# Patient Record
Sex: Female | Born: 1980 | Race: White | Hispanic: No | Smoking: Never smoker
Health system: Southern US, Community
[De-identification: ages and names within clinical notes are randomized; demographics above are authoritative.]

## PROBLEM LIST (undated history)

## (undated) DIAGNOSIS — Z3490 Encounter for supervision of normal pregnancy, unspecified, unspecified trimester: Secondary | ICD-10-CM

## (undated) DIAGNOSIS — Z9889 Other specified postprocedural states: Secondary | ICD-10-CM

## (undated) DIAGNOSIS — D649 Anemia, unspecified: Secondary | ICD-10-CM

## (undated) DIAGNOSIS — F419 Anxiety disorder, unspecified: Secondary | ICD-10-CM

## (undated) DIAGNOSIS — M549 Dorsalgia, unspecified: Secondary | ICD-10-CM

## (undated) HISTORY — DX: Anxiety disorder, unspecified: F41.9

## (undated) HISTORY — PX: WISDOM TOOTH EXTRACTION: SHX21

## (undated) HISTORY — DX: Anemia, unspecified: D64.9

## (undated) HISTORY — PX: BACK SURGERY: SHX140

---
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
## 2004-03-27 ENCOUNTER — Other Ambulatory Visit: Admission: RE | Admit: 2004-03-27 | Discharge: 2004-03-27 | Payer: Self-pay | Admitting: Obstetrics and Gynecology

## 2019-12-11 ENCOUNTER — Other Ambulatory Visit: Payer: Self-pay

## 2019-12-11 ENCOUNTER — Ambulatory Visit
Admission: EM | Admit: 2019-12-11 | Discharge: 2019-12-11 | Disposition: A | Discharge status: Home / Self Care | Payer: 59

## 2019-12-11 DIAGNOSIS — M546 Pain in thoracic spine: Secondary | ICD-10-CM

## 2019-12-11 DIAGNOSIS — S239XXA Sprain of unspecified parts of thorax, initial encounter: Secondary | ICD-10-CM | POA: Diagnosis not present

## 2019-12-11 MED ORDER — CYCLOBENZAPRINE HCL 10 MG PO TABS
10.0000 mg | ORAL_TABLET | Freq: Two times a day (BID) | ORAL | 0 refills | Status: DC | PRN
Start: 2019-12-11 — End: 2020-07-15

## 2019-12-11 NOTE — Discharge Instructions (Signed)
You are likely experiencing a pulled muscle in your back.   You can take Tylenol for pain and I have sent in Flexeril for you to take twice daily as needed for pain.  Follow up if not feeling better in 1-2 weeks.

## 2019-12-11 NOTE — ED Triage Notes (Signed)
Pt presents  with c/o right flank pain that began yesterday  . Pt has h/o back pain but states this feels like stabbing pain in mid right back with movement

## 2019-12-12 NOTE — ED Provider Notes (Signed)
[facility]   [id number] 12/11/19 Arrival Time: 1346  IW:PYKDX PAIN  SUBJECTIVE: History from: patient. Christine Ritter is a 39 y.o. female complains of right thoracic back pain that began yesterday.  Denies a precipitating event or specific injury.  Localizes the pain to the R thoracic back.  Describes the pain as intermittent and achy in character.  Has tried OTC medications without relief.  Symptoms are made worse with activity.  Denies similar symptoms in the past.  Denies fever, chills, erythema, ecchymosis, effusion, weakness, numbness and tingling, saddle paresthesias, loss of bowel or bladder function.      ROS: As per HPI.  All other pertinent ROS negative.     History reviewed. No pertinent past medical history. Past Surgical History:  Procedure Laterality Date  . BACK SURGERY     Not on File No current facility-administered medications on file prior to encounter.   Current Outpatient Medications on File Prior to Encounter  Medication Sig Dispense Refill  . metFORMIN (GLUCOPHAGE) 500 MG tablet Take 500 mg by mouth daily.     Social History   Socioeconomic History  . Marital status: Married    Spouse name: Not on file  . Number of children: Not on file  . Years of education: Not on file  . Highest education level: Not on file  Occupational History  . Not on file  Tobacco Use  . Smoking status: Never Smoker  . Smokeless tobacco: Never Used  Substance and Sexual Activity  . Alcohol use: Yes  . Drug use: Not Currently  . Sexual activity: Not on file  Other Topics Concern  . Not on file  Social History Narrative  . Not on file   Social Determinants of Health   Financial Resource Strain:   . Difficulty of Paying Living Expenses:   Food Insecurity:   . Worried About Programme researcher, broadcasting/film/video in the Last Year:   . Barista in the Last Year:   Transportation Needs:   . Freight forwarder (Medical):   Marland Kitchen Lack of Transportation (Non-Medical):    Physical Activity:   . Days of Exercise per Week:   . Minutes of Exercise per Session:   Stress:   . Feeling of Stress :   Social Connections:   . Frequency of Communication with Friends and Family:   . Frequency of Social Gatherings with Friends and Family:   . Attends Religious Services:   . Active Member of Clubs or Organizations:   . Attends Banker Meetings:   Marland Kitchen Marital Status:   Intimate Partner Violence:   . Fear of Current or Ex-Partner:   . Emotionally Abused:   Marland Kitchen Physically Abused:   . Sexually Abused:    History reviewed. No pertinent family history.  OBJECTIVE:  Vitals:   12/11/19 1407  BP: 117/81  Pulse: 88  Resp: 18  Temp: 99.1 F (37.3 C)  SpO2: 96%    General appearance: ALERT; in no acute distress.  Head: NCAT Lungs: Normal respiratory effort CV: XX pulses 2+ bilaterally. Cap refill < 2 seconds Musculoskeletal:  Inspection: Skin warm, dry, clear and intact without obvious erythema, effusion, or ecchymosis.  Palpation: tenderness to palpation at R thoracic back, no swelling, no deformity ROM: FROM active and passive Strength: 5/5 shld abduction, 5/5 shld adduction, 5/5 elbow flexion, 5/5 elbow extension, 5/5 grip strength, 5/5 hip flexion, 5/5 knee abduction, 5/5 knee adduction, 5/5 knee flexion, 5/5 knee extension, 5/5 dorsiflexion, 5/5  plantar flexion Stability: Anterior/ posterior drawer intact Skin: warm and dry Neurologic: Ambulates without difficulty; Sensation intact about the upper/ lower extremities Psychological: alert and cooperative; normal mood and affect  DIAGNOSTIC STUDIES:  No results found.   ASSESSMENT & PLAN:  1. Acute right-sided thoracic back pain   2. Thoracic back sprain, initial encounter       Meds ordered this encounter  Medications  . cyclobenzaprine (FLEXERIL) 10 MG tablet    Sig: Take 1 tablet (10 mg total) by mouth 2 (two) times daily as needed for muscle spasms.    Dispense:  20 tablet     Refill:  0    Order Specific Question:   Supervising Provider    Answer:   Chase Picket [id number]    Continue conservative management of rest, ice, and gentle stretches Take tylenol as needed for pain relief Take cyclobenzaprine at nighttime for symptomatic relief. Avoid driving or operating heavy machinery while using medication. Follow up with PCP if symptoms persist Return or go to the ER if you have any new or worsening symptoms (fever, chills, chest pain, abdominal pain, changes in bowel or bladder habits, pain radiating into lower legs, etc...)    Reviewed expectations re: course of current medical issues. Questions answered. Outlined signs and symptoms indicating need for more acute intervention. Patient verbalized understanding. After Visit Summary given.      Faustino Congress, NP 12/12/19 1123

## 2020-05-27 LAB — CYSTIC FIBROSIS DIAGNOSTIC STUDY: Interpretation-CFDNA:: NEGATIVE

## 2020-05-27 LAB — OB RESULTS CONSOLE GC/CHLAMYDIA
Chlamydia: NEGATIVE
Gonorrhea: NEGATIVE

## 2020-05-27 LAB — OB RESULTS CONSOLE RUBELLA ANTIBODY, IGM: Rubella: IMMUNE

## 2020-05-27 LAB — OB RESULTS CONSOLE HEPATITIS B SURFACE ANTIGEN: Hepatitis B Surface Ag: NEGATIVE

## 2020-05-27 LAB — OB RESULTS CONSOLE HIV ANTIBODY (ROUTINE TESTING): HIV: NONREACTIVE

## 2020-05-27 LAB — HEPATITIS C ANTIBODY: HCV Ab: NEGATIVE

## 2020-05-27 LAB — OB RESULTS CONSOLE ABO/RH: RH Type: POSITIVE

## 2020-05-27 LAB — OB RESULTS CONSOLE RPR: RPR: NONREACTIVE

## 2020-05-27 LAB — OB RESULTS CONSOLE ANTIBODY SCREEN: Antibody Screen: NEGATIVE

## 2020-05-27 LAB — SICKLE CELL SCREEN: Sickle Cell Screen: NEGATIVE

## 2020-07-15 ENCOUNTER — Ambulatory Visit
Admission: EM | Admit: 2020-07-15 | Discharge: 2020-07-15 | Disposition: A | Discharge status: Home / Self Care | Payer: 59 | Attending: Internal Medicine | Admitting: Internal Medicine

## 2020-07-15 ENCOUNTER — Other Ambulatory Visit: Payer: Self-pay

## 2020-07-15 DIAGNOSIS — Z1152 Encounter for screening for COVID-19: Secondary | ICD-10-CM | POA: Insufficient documentation

## 2020-07-15 DIAGNOSIS — J029 Acute pharyngitis, unspecified: Secondary | ICD-10-CM | POA: Diagnosis not present

## 2020-07-15 LAB — POCT RAPID STREP A (OFFICE): Rapid Strep A Screen: NEGATIVE

## 2020-07-15 NOTE — ED Triage Notes (Signed)
Pt presents with c/o sore throat and nasal congestion and sore throat that began yesterday

## 2020-07-15 NOTE — ED Provider Notes (Signed)
[facility]    CSN: [id number] Arrival date & time: 07/15/20  1024      History   Chief Complaint Chief Complaint  Patient presents with  . Nasal Congestion    HPI Christine Ritter is a 39 y.o. pleasant female currently [redacted] weeks pregnant comes to the urgent care with 1 day history of sore throat and nasal congestion.  Patient symptoms started yesterday afternoon and has been persistent.  No sputum production.  No shortness of breath or wheezing.  No chest tightness.  No dizziness or near syncopal episode.  No vomiting or diarrhea.  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  She is vaccinated against COVID-19 virus.   HPI  History reviewed. No pertinent past medical history.  There are no problems to display for this patient.   Past Surgical History:  Procedure Laterality Date  . BACK SURGERY      OB History    Gravida  1   Para      Term      Preterm      AB      Living        SAB      TAB      Ectopic      Multiple      Live Births               Home Medications    Prior to Admission medications   Medication Sig Start Date End Date Taking? Authorizing Provider  Prenatal Vit-Fe Fumarate-FA (PNV PRENATAL PLUS MULTIVITAMIN) 27-1 MG TABS Take 1 tablet by mouth daily.    [provider]  metFORMIN (GLUCOPHAGE) 500 MG tablet Take 500 mg by mouth daily. 11/26/19 07/15/20  [provider]    Family History History reviewed. No pertinent family history.  Social History Social History   Tobacco Use  . Smoking status: Never Smoker  . Smokeless tobacco: Never Used  Substance Use Topics  . Alcohol use: Yes  . Drug use: Not Currently     Allergies   Penicillins   Review of Systems Review of Systems  Constitutional: Negative.   HENT: Positive for congestion, rhinorrhea and sore throat.   Respiratory: Positive for cough.   Cardiovascular: Negative.   Gastrointestinal: Negative.       Physical Exam Triage Vital Signs ED Triage Vitals  Enc Vitals Group     BP 07/15/20 1103 106/74     Pulse Rate 07/15/20 1103 71     Resp 07/15/20 1103 20     Temp 07/15/20 1103 98.7 F (37.1 C)     Temp src --      SpO2 07/15/20 1103 97 %     Weight --      Height --      Head Circumference --      Peak Flow --      Pain Score 07/15/20 1059 3     Pain Loc --      Pain Edu? --      Excl. in GC? --    No data found.  Updated Vital Signs BP 106/74   Pulse 71   Temp 98.7 F (37.1 C)   Resp 20   LMP 11/20/2019   SpO2 97%   Visual Acuity Right Eye Distance:   Left Eye Distance:   Bilateral Distance:    Right Eye Near:   Left Eye Near:    Bilateral Near:     Physical  Exam Vitals and nursing note reviewed.  Constitutional:      General: She is not in acute distress.    Appearance: She is not ill-appearing.  HENT:     Right Ear: Tympanic membrane normal.     Left Ear: Tympanic membrane normal.     Mouth/Throat:     Mouth: Mucous membranes are moist.     Pharynx: No oropharyngeal exudate or posterior oropharyngeal erythema.  Cardiovascular:     Rate and Rhythm: Normal rate and regular rhythm.     Pulses: Normal pulses.     Heart sounds: Normal heart sounds.  Pulmonary:     Effort: Pulmonary effort is normal. No respiratory distress.     Breath sounds: Normal breath sounds. No wheezing or rhonchi.  Skin:    Capillary Refill: Capillary refill takes less than 2 seconds.  Neurological:     Mental Status: She is alert.      UC Treatments / Results  Labs (all labs ordered are listed, but only abnormal results are displayed) Labs Reviewed  CULTURE, GROUP A STREP ([facility])  COVID-19, FLU A+B AND RSV  POCT RAPID STREP A (OFFICE)    EKG   Radiology No results found.  Procedures Procedures (including critical care time)  Medications Ordered in UC Medications - No data to display  Initial Impression / Assessment and Plan / UC Course  I have  reviewed the triage vital signs and the nursing notes.  Pertinent labs & imaging results that were available during my care of the patient were reviewed by me and considered in my medical decision making (see chart for details).     1.  Acute viral pharyngitis: Warm salt water gargle Tylenol as needed for pain Point-of-care strep is negative Throat culture sent COVID-19/flu PCR is pending Patient is advised to quarantine until COVID-19 test results are available. Final Clinical Impressions(s) / UC Diagnoses   Final diagnoses:  Acute viral pharyngitis     Discharge Instructions     Warm salt water gargle Tylenol as needed for pain and/or fever Please quarantine until COVID-19 test results available Saline nasal spray as needed for nasal congestion If your symptoms worsen please return to urgent care to be reevaluated.   ED Prescriptions    None     PDMP not reviewed this encounter.   Merrilee Jansky, MD 07/15/20 1200

## 2020-07-15 NOTE — Discharge Instructions (Signed)
Warm salt water gargle Tylenol as needed for pain and/or fever Please quarantine until COVID-19 test results available Saline nasal spray as needed for nasal congestion If your symptoms worsen please return to urgent care to be reevaluated.

## 2020-07-17 LAB — COVID-19, FLU A+B AND RSV
Influenza A, NAA: NOT DETECTED
Influenza B, NAA: NOT DETECTED
RSV, NAA: NOT DETECTED
SARS-CoV-2, NAA: NOT DETECTED

## 2020-07-18 LAB — CULTURE, GROUP A STREP (THRC)

## 2020-08-20 ENCOUNTER — Other Ambulatory Visit: Payer: Self-pay

## 2020-08-20 ENCOUNTER — Ambulatory Visit
Admission: EM | Admit: 2020-08-20 | Discharge: 2020-08-20 | Disposition: A | Discharge status: Home / Self Care | Payer: 59

## 2020-08-20 ENCOUNTER — Encounter: Payer: Self-pay | Admitting: Emergency Medicine

## 2020-08-20 DIAGNOSIS — J3489 Other specified disorders of nose and nasal sinuses: Secondary | ICD-10-CM

## 2020-08-20 DIAGNOSIS — R509 Fever, unspecified: Secondary | ICD-10-CM

## 2020-08-20 DIAGNOSIS — R059 Cough, unspecified: Secondary | ICD-10-CM

## 2020-08-20 DIAGNOSIS — B349 Viral infection, unspecified: Secondary | ICD-10-CM

## 2020-08-20 DIAGNOSIS — R5383 Other fatigue: Secondary | ICD-10-CM

## 2020-08-20 DIAGNOSIS — R0981 Nasal congestion: Secondary | ICD-10-CM

## 2020-08-20 HISTORY — DX: Encounter for supervision of normal pregnancy, unspecified, unspecified trimester: Z34.90

## 2020-08-20 NOTE — ED Provider Notes (Signed)
[facility]   [id number] 08/20/20 Arrival Time: 0827   CC: COVID symptoms  SUBJECTIVE: History from: patient.  Christine Ritter is a 40 y.o. female who presents with sore throat, nasal congestion, bilateral ear pain, headache, fever since yesterday.  She is [redacted] weeks pregnant. Denies sick exposure to COVID, flu or strep. Denies recent travel. Has negative history of Covid. Has completed Covid vaccines.  Has not had booster. Has not taken OTC medications for this. There are no aggravating or alleviating factors. Denies previous symptoms in the past. Denies sinus pain, SOB, wheezing, chest pain, changes in bowel or bladder habits.    ROS: As per HPI.  All other pertinent ROS negative.     Past Medical History:  Diagnosis Date  . Pregnancy    Past Surgical History:  Procedure Laterality Date  . BACK SURGERY     Allergies  Allergen Reactions  . Penicillins    No current facility-administered medications on file prior to encounter.   Current Outpatient Medications on File Prior to Encounter  Medication Sig Dispense Refill  . aspirin EC 81 MG tablet Take 81 mg by mouth daily. Swallow whole.    . Prenatal Vit-Fe Fumarate-FA (PNV PRENATAL PLUS MULTIVITAMIN) 27-1 MG TABS Take 1 tablet by mouth daily.    . [DISCONTINUED] metFORMIN (GLUCOPHAGE) 500 MG tablet Take 500 mg by mouth daily.     Social History   Socioeconomic History  . Marital status: Legally Separated    Spouse name: Not on file  . Number of children: Not on file  . Years of education: Not on file  . Highest education level: Not on file  Occupational History  . Not on file  Tobacco Use  . Smoking status: Never Smoker  . Smokeless tobacco: Never Used  Substance and Sexual Activity  . Alcohol use: Yes  . Drug use: Not Currently  . Sexual activity: Not on file  Other Topics Concern  . Not on file  Social History Narrative  . Not on file   Social Determinants of Health   Financial Resource Strain: Not  on file  Food Insecurity: Not on file  Transportation Needs: Not on file  Physical Activity: Not on file  Stress: Not on file  Social Connections: Not on file  Intimate Partner Violence: Not on file   History reviewed. No pertinent family history.  OBJECTIVE:  Vitals:   08/20/20 0905 08/20/20 0910  BP:  106/69  Pulse:  95  Resp:  20  Temp:  98.6 F (37 C)  TempSrc:  Oral  SpO2:  95%  Weight: 266 lb (120.7 kg)   Height: 5\' 8"  (1.727 m)      General appearance: alert; appears fatigued, but nontoxic; speaking in full sentences and tolerating own secretions HEENT: NCAT; Ears: EACs clear, TMs pearly gray; Eyes: PERRL.  EOM grossly intact. Sinuses: nontender; Nose: nares patent with clear rhinorrhea, Throat: oropharynx erythematous, cobblestoning present, tonsils non erythematous or enlarged, uvula midline  Neck: supple without LAD Lungs: unlabored respirations, symmetrical air entry; cough: mild; no respiratory distress; CTAB Heart: regular rate and rhythm.  Radial pulses 2+ symmetrical bilaterally Skin: warm and dry Psychological: alert and cooperative; normal mood and affect  LABS:  No results found for this or any previous visit (from the past 24 hour(s)).   ASSESSMENT & PLAN:  1. Viral illness   2. Cough   3. Other fatigue   4. Rhinorrhea   5. Nasal congestion   6. Fever, unspecified  fever cause     Continue supportive care at home COVID and flu testing ordered.  It will take between 2-3 days for test results. Someone will contact you regarding abnormal results.   Work note provided Patient should remain in quarantine until they have received Covid results.  If negative you may resume normal activities (go back to work/school) while practicing hand hygiene, social distance, and mask wearing.  If positive, patient should remain in quarantine for at least 5 days from symptom onset AND greater than 72 hours after symptoms resolution (absence of fever without the use of  fever-reducing medication and improvement in respiratory symptoms), whichever is longer Get plenty of rest and push fluids Use OTC zyrtec for nasal congestion, runny nose, and/or sore throat Use OTC flonase for nasal congestion and runny nose Use medications daily for symptom relief Use OTC medications like ibuprofen or tylenol as needed fever or pain Call or go to the ED if you have any new or worsening symptoms such as fever, worsening cough, shortness of breath, chest tightness, chest pain, turning blue, changes in mental status.  Reviewed expectations re: course of current medical issues. Questions answered. Outlined signs and symptoms indicating need for more acute intervention. Patient verbalized understanding. After Visit Summary given.         Moshe Cipro, NP 08/20/20 1202

## 2020-08-20 NOTE — Discharge Instructions (Signed)
Your COVID test is pending.  You should self quarantine until the test result is back.    Take Tylenol or ibuprofen as needed for fever or discomfort.  Rest and keep yourself hydrated.    Follow-up with your primary care provider if your symptoms are not improving.     

## 2020-08-20 NOTE — ED Triage Notes (Addendum)
Pt c/o of sore throat, nasal congestion, bilateral ear pain, headache and fever x 1 day. She is [redacted] weeks pregnant. Vomited 3-4 times within 24 hours.

## 2020-08-22 LAB — SARS-COV-2, NAA 2 DAY TAT

## 2020-08-22 LAB — NOVEL CORONAVIRUS, NAA: SARS-CoV-2, NAA: DETECTED — AB

## 2020-11-19 ENCOUNTER — Encounter: Payer: Self-pay | Admitting: *Deleted

## 2020-11-19 DIAGNOSIS — O0990 Supervision of high risk pregnancy, unspecified, unspecified trimester: Secondary | ICD-10-CM | POA: Insufficient documentation

## 2020-11-19 DIAGNOSIS — O24419 Gestational diabetes mellitus in pregnancy, unspecified control: Secondary | ICD-10-CM | POA: Insufficient documentation

## 2020-11-20 ENCOUNTER — Other Ambulatory Visit: Payer: Self-pay

## 2020-11-20 ENCOUNTER — Ambulatory Visit (INDEPENDENT_AMBULATORY_CARE_PROVIDER_SITE_OTHER): Payer: 59 | Admitting: Advanced Practice Midwife

## 2020-11-20 ENCOUNTER — Encounter: Payer: Self-pay | Admitting: Advanced Practice Midwife

## 2020-11-20 ENCOUNTER — Other Ambulatory Visit (HOSPITAL_COMMUNITY)
Admission: RE | Admit: 2020-11-20 | Discharge: 2020-11-20 | Disposition: A | Discharge status: Home / Self Care | Payer: 59 | Source: Ambulatory Visit | Attending: Advanced Practice Midwife | Admitting: Advanced Practice Midwife

## 2020-11-20 ENCOUNTER — Ambulatory Visit: Payer: 59 | Admitting: *Deleted

## 2020-11-20 VITALS — BP 118/71 | HR 78 | Wt 270.0 lb

## 2020-11-20 DIAGNOSIS — Z23 Encounter for immunization: Secondary | ICD-10-CM | POA: Diagnosis not present

## 2020-11-20 DIAGNOSIS — O24410 Gestational diabetes mellitus in pregnancy, diet controlled: Secondary | ICD-10-CM

## 2020-11-20 DIAGNOSIS — O0993 Supervision of high risk pregnancy, unspecified, third trimester: Secondary | ICD-10-CM | POA: Diagnosis present

## 2020-11-20 DIAGNOSIS — Z3A36 36 weeks gestation of pregnancy: Secondary | ICD-10-CM | POA: Insufficient documentation

## 2020-11-20 DIAGNOSIS — Z803 Family history of malignant neoplasm of breast: Secondary | ICD-10-CM

## 2020-11-20 NOTE — Progress Notes (Signed)
   HIGH-RISK PREGNANCY VISIT Patient name: Christine Ritter MRN [id number]  Date of birth: Dec 24, 1980 Chief Complaint:   Initial Prenatal Visit (Transfer care 36wk) OB is retiring end of this month, had planned IOL on 5/3 d/t GDM. (due to being a [employer], prefers 5/4!)  Was on Metformin, prior to pregnancy for prediabetes.  Not checking blood sugars anymore except on occasion, Has been <120 every time she has checked it.  History of Present Illness:   Christine Ritter is a 40 y.o. G4P2002 female at [redacted]w[redacted]d with an Estimated Date of Delivery: 12/14/20 being seen today for ongoing management of a high-risk pregnancy complicated by A1DM  Contractions: Not present. Vag. Bleeding: None.  Movement: Present. denies leaking of fluid.  Review of Systems:   Pertinent items are noted in HPI Denies abnormal vaginal discharge w/ itching/odor/irritation, headaches, visual changes, shortness of breath, chest pain, abdominal pain, severe nausea/vomiting, or problems with urination or bowel movements unless otherwise stated above. Pertinent History Reviewed:  Reviewed past medical,surgical, social, obstetrical and family history.  Reviewed problem list, medications and allergies. Physical Assessment:   Vitals:   11/20/20 1046  BP: 118/71  Pulse: 78  Weight: 270 lb (122.5 kg)  Body mass index is 41.05 kg/m.           Physical Examination:   General appearance: alert, well appearing, and in no distress  Mental status: alert, oriented to person, place, and time  Skin: warm & dry   Extremities: Edema: None    Cardiovascular: normal heart rate noted  Respiratory: normal respiratory effort, no distress  Abdomen: gravid, soft, non-tender  Pelvic: Cervical exam performed  Dilation: 1 Effacement (%): 0 Station: -3  Fetal Status: Fetal Heart Rate (bpm): 155   Movement: Present Presentation: Vertex  Fetal Surveillance Testing today: doppler   No results found for this or any previous visit (from the past 24  hour(s)).  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   Labs/procedures today: GBS/GC/CHL   Reviewed: Term labor symptoms and general obstetric precautions including but not limited to vaginal bleeding, contractions, leaking of fluid and fetal movement were reviewed in detail with the patient.  All questions were answered.   Follow-up: Return for [facility] Mychart visit   either Thursday or Friday; 2 weeks for US/EFW/and HROB.  No future appointments.  Orders Placed This Encounter  Procedures  . Strep Gp B NAA+Rflx  . Tdap vaccine greater than or equal to 7yo IM  . OB RESULTS CONSOLE RPR  . OB RESULTS CONSOLE HIV antibody  . OB RESULTS CONSOLE Rubella Antibody  . OB RESULTS CONSOLE Hepatitis B surface antigen  . Hepatitis C antibody  . Sickle cell screen  . Cystic fibrosis diagnostic study  . OB RESULTS CONSOLE GC/Chlamydia  . OB RESULTS CONSOLE ABO/Rh  . OB RESULTS CONSOLE Antibody Screen   Jacklyn Shell DNP, CNM 11/20/2020 11:46 AM

## 2020-11-20 NOTE — Patient Instructions (Signed)
Christine Ritter, I greatly value your feedback.  If you receive a survey following your visit with [place] today, we appreciate you taking the time to fill it out.  Thanks, [name], DNP, CNM  [facility] HAS MOVED!!! It is now [facility] ([address] [place], [place] [address]) Entrance located off of [address] Free 24/7 valet parking   Go to [email or web address] to register for FREE online childbirth classes    Call the office [phone]) or go to [facility] if:  You begin to have strong, frequent contractions  Your water breaks.  Sometimes it is a big gush of fluid, sometimes it is just a trickle that keeps getting your panties wet or running down your legs  You have vaginal bleeding.  It is normal to have a small amount of spotting if your cervix was checked.   You don't feel your baby moving like normal.  If you don't, get you something to eat and drink and lay down and focus on feeling your baby move.  You should feel at least 10 movements in 2 hours.  If you don't, you should call the office or go to [facility].   Home Blood Pressure Monitoring for Patients   Your provider has recommended that you check your blood pressure (BP) at least once a week at home. If you do not have a blood pressure cuff at home, one will be provided for you. Contact your provider if you have not received your monitor within 1 week.   Helpful Tips for Accurate Home Blood Pressure Checks  . Don't smoke, exercise, or drink caffeine 30 minutes before checking your BP . Use the restroom before checking your BP (a full bladder can raise your pressure) . Relax in a comfortable upright chair . Feet on the ground . Left arm resting comfortably on a flat surface at the level of your heart . Legs uncrossed . Back supported . Sit quietly and don't talk . Place the cuff on your bare arm . Adjust snuggly, so that only two fingertips can fit  between your skin and the top of the cuff . Check 2 readings separated by at least one minute . Keep a log of your BP readings . For a visual, please reference this diagram: http://ccnc.care/bpdiagram  Provider Name: [facility]     Phone: [phone]  Zone 1: ALL CLEAR  Continue to monitor your symptoms:  . BP reading is less than 140 (top number) or less than 90 (bottom number)  . No right upper stomach pain . No headaches or seeing spots . No feeling nauseated or throwing up . No swelling in face and hands  Zone 2: CAUTION Call your doctor's office for any of the following:  . BP reading is greater than 140 (top number) or greater than 90 (bottom number)  . Stomach pain under your ribs in the middle or right side . Headaches or seeing spots . Feeling nauseated or throwing up . Swelling in face and hands  Zone 3: EMERGENCY  Seek immediate medical care if you have any of the following:  . BP reading is greater than160 (top number) or greater than 110 (bottom number) . Severe headaches not improving with Tylenol . Serious difficulty catching your breath . Any worsening symptoms from Zone 2

## 2020-11-21 LAB — CERVICOVAGINAL ANCILLARY ONLY
Chlamydia: NEGATIVE
Comment: NEGATIVE
Comment: NORMAL
Neisseria Gonorrhea: NEGATIVE

## 2020-11-23 LAB — STREP GP B NAA+RFLX: Strep Gp B NAA+Rflx: NEGATIVE

## 2020-11-26 DIAGNOSIS — Z029 Encounter for administrative examinations, unspecified: Secondary | ICD-10-CM

## 2020-11-28 ENCOUNTER — Ambulatory Visit (INDEPENDENT_AMBULATORY_CARE_PROVIDER_SITE_OTHER): Payer: 59 | Admitting: Obstetrics & Gynecology

## 2020-11-28 ENCOUNTER — Other Ambulatory Visit: Payer: Self-pay

## 2020-11-28 ENCOUNTER — Encounter: Payer: Self-pay | Admitting: Obstetrics & Gynecology

## 2020-11-28 VITALS — BP 122/68 | HR 86 | Wt 270.6 lb

## 2020-11-28 DIAGNOSIS — O0993 Supervision of high risk pregnancy, unspecified, third trimester: Secondary | ICD-10-CM | POA: Diagnosis not present

## 2020-11-28 DIAGNOSIS — O09523 Supervision of elderly multigravida, third trimester: Secondary | ICD-10-CM | POA: Diagnosis not present

## 2020-11-28 DIAGNOSIS — O24410 Gestational diabetes mellitus in pregnancy, diet controlled: Secondary | ICD-10-CM

## 2020-11-28 NOTE — Progress Notes (Signed)
HIGH-RISK PREGNANCY VISIT Patient name: Christine Ritter MRN [id number]  Date of birth: 1981/04/21 Chief Complaint:   Routine Prenatal Visit  History of Present Illness:   Christine Ritter is a 40 y.o. G41P2002 female at [redacted]w[redacted]d with an Estimated Date of Delivery: 12/14/20 being seen today for ongoing management of a high-risk pregnancy complicated by   Diabetes mellitus A1DM- pt recently transferred care @ 36wks Per pt- her prior OB did not seem as concerned about her sugars -All sugars elevated though trending down once she started making diet changes -previously on metformin  Today she reports occasional vaginal pain/contractions.   Contractions: Not present. Vag. Bleeding: None.  Movement: Present. denies leaking of fluid.   Depression screen [facility] 2/9 11/20/2020  Decreased Interest 0  Down, Depressed, Hopeless 0  PHQ - 2 Score 0  Altered sleeping 1  Tired, decreased energy 1  Change in appetite 0  Feeling bad or failure about yourself  0  Trouble concentrating 0  Moving slowly or fidgety/restless 0  Suicidal thoughts 0  PHQ-9 Score 2     Current Outpatient Medications  Medication Instructions  . aspirin EC 81 mg, Oral, Daily, Swallow whole.  . loratadine (CLARITIN) 10 mg, Oral, Daily  . Prenatal Vit-Fe Fumarate-FA (PNV PRENATAL PLUS MULTIVITAMIN) 27-1 MG TABS 1 tablet, Oral, Daily  . valACYclovir (VALTREX) 500 MG tablet Daily     Review of Systems:   Pertinent items are noted in HPI Denies abnormal vaginal discharge w/ itching/odor/irritation, headaches, visual changes, shortness of breath, chest pain, abdominal pain, severe nausea/vomiting, or problems with urination or bowel movements unless otherwise stated above. Pertinent History Reviewed:  Reviewed past medical,surgical, social, obstetrical and family history.  Reviewed problem list, medications and allergies. Physical Assessment:   Vitals:   11/28/20 1130  BP: 122/68  Pulse: 86  Weight: 270 lb 9.6 oz (122.7 kg)   Body mass index is 41.14 kg/m.           Physical Examination:   General appearance: alert, well appearing, and in no distress  Mental status: alert, oriented to person, place, and time  Skin: warm & dry   Extremities: Edema: None    Cardiovascular: normal heart rate noted  Respiratory: normal respiratory effort, no distress  Abdomen: gravid, soft, non-tender  Pelvic: Cervical exam performed  Dilation: 1 Effacement (%): 0 Station: -3  Fetal Status: Fetal Heart Rate (bpm): 155 Fundal Height: 36 cm Movement: Present Presentation: Vertex  Fetal Surveillance Testing today: none   Chaperone: declined    No results found for this or any previous visit (from the past 24 hour(s)).   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   Labs/procedures today: none  Treatment Plan:  May start Metformin pending sugars, also to start antepartum testing, []  IOL @ 39wks   Reviewed: Term labor symptoms and general obstetric precautions including but not limited to vaginal bleeding, contractions, leaking of fluid and fetal movement were reviewed in detail with the patient.  All questions were answered.   Follow-up: Return  in about 1 week (around 12/05/2020) for HROB visit.   Future Appointments  Date Time Provider Department Center  12/03/2020  3:30 PM [facility] [place] [facility] None  12/04/2020  4:10 PM Eure, Amaryllis Dyke, MD [facility]    No orders of the defined types were placed in this encounter.   Myna Hidalgo, DO Attending Obstetrician & Gynecologist,  [facility] for [employer], [facility]

## 2020-11-30 ENCOUNTER — Other Ambulatory Visit: Payer: Self-pay | Admitting: Obstetrics & Gynecology

## 2020-12-01 ENCOUNTER — Telehealth: Payer: Self-pay | Admitting: Obstetrics & Gynecology

## 2020-12-01 ENCOUNTER — Other Ambulatory Visit: Payer: Self-pay | Admitting: Obstetrics & Gynecology

## 2020-12-01 ENCOUNTER — Other Ambulatory Visit: Payer: Self-pay

## 2020-12-01 DIAGNOSIS — Z363 Encounter for antenatal screening for malformations: Secondary | ICD-10-CM

## 2020-12-01 DIAGNOSIS — O24410 Gestational diabetes mellitus in pregnancy, diet controlled: Secondary | ICD-10-CM

## 2020-12-01 MED ORDER — METFORMIN HCL 500 MG PO TABS: 500.0000 mg | ORAL_TABLET | Freq: Every day | ORAL | 11 refills | Status: DC

## 2020-12-01 MED ORDER — VALACYCLOVIR HCL 500 MG PO TABS: 500.0000 mg | ORAL_TABLET | Freq: Three times a day (TID) | ORAL | 3 refills | Status: DC

## 2020-12-01 NOTE — Telephone Encounter (Signed)
Patient stated that her metformin wasn't called into the pharmacy and wants to discuss valACYclovir (VALTREX) 500 MG tablet . Clinical staff will follow up with patient.

## 2020-12-02 ENCOUNTER — Encounter: Payer: Self-pay | Admitting: Obstetrics & Gynecology

## 2020-12-02 ENCOUNTER — Other Ambulatory Visit: Payer: Self-pay | Admitting: Advanced Practice Midwife

## 2020-12-03 ENCOUNTER — Ambulatory Visit (INDEPENDENT_AMBULATORY_CARE_PROVIDER_SITE_OTHER): Payer: 59

## 2020-12-03 ENCOUNTER — Other Ambulatory Visit: Payer: Self-pay

## 2020-12-03 DIAGNOSIS — O24410 Gestational diabetes mellitus in pregnancy, diet controlled: Secondary | ICD-10-CM

## 2020-12-03 DIAGNOSIS — Z363 Encounter for antenatal screening for malformations: Secondary | ICD-10-CM | POA: Diagnosis not present

## 2020-12-03 DIAGNOSIS — O0993 Supervision of high risk pregnancy, unspecified, third trimester: Secondary | ICD-10-CM

## 2020-12-03 DIAGNOSIS — Z3A38 38 weeks gestation of pregnancy: Secondary | ICD-10-CM | POA: Diagnosis not present

## 2020-12-03 NOTE — Progress Notes (Signed)
[place] 38+3 wks,cephalic,FHR 120 bpm,anterior placenta gr 3,AFI 15.9 cm,BPP 8/8,EFW 4933 g 99.9%

## 2020-12-04 ENCOUNTER — Ambulatory Visit (INDEPENDENT_AMBULATORY_CARE_PROVIDER_SITE_OTHER): Payer: 59 | Admitting: Obstetrics & Gynecology

## 2020-12-04 VITALS — BP 120/80 | HR 89 | Wt 276.0 lb

## 2020-12-04 DIAGNOSIS — O09523 Supervision of elderly multigravida, third trimester: Secondary | ICD-10-CM

## 2020-12-04 DIAGNOSIS — O24410 Gestational diabetes mellitus in pregnancy, diet controlled: Secondary | ICD-10-CM

## 2020-12-04 DIAGNOSIS — O0993 Supervision of high risk pregnancy, unspecified, third trimester: Secondary | ICD-10-CM | POA: Diagnosis not present

## 2020-12-04 DIAGNOSIS — Z1389 Encounter for screening for other disorder: Secondary | ICD-10-CM

## 2020-12-04 LAB — POCT URINALYSIS DIPSTICK OB
Blood, UA: NEGATIVE
Glucose, UA: NEGATIVE
Leukocytes, UA: NEGATIVE
Nitrite, UA: NEGATIVE

## 2020-12-04 NOTE — Progress Notes (Unsigned)
HIGH-RISK PREGNANCY VISIT Patient name: Christine Ritter MRN [id number]  Date of birth: October 30, 1980 Chief Complaint:   Routine Prenatal Visit and High Risk Gestation  History of Present Illness:   Christine Ritter is a 40 y.o. G28P2002 female at [redacted]w[redacted]d with an Estimated Date of Delivery: 12/14/20 being seen today for ongoing management of a high-risk pregnancy complicated by {High Risk OB:23190}.  Today she reports {sx:14538}.  Depression screen [facility] 2/9 11/20/2020  Decreased Interest 0  Down, Depressed, Hopeless 0  PHQ - 2 Score 0  Altered sleeping 1  Tired, decreased energy 1  Change in appetite 0  Feeling bad or failure about yourself  0  Trouble concentrating 0  Moving slowly or fidgety/restless 0  Suicidal thoughts 0  PHQ-9 Score 2    Contractions: Not present. Vag. Bleeding: None.  Movement: Present. {Actions; denies-reports:120008} leaking of fluid.  Review of Systems:   Pertinent items are noted in HPI Denies abnormal vaginal discharge w/ itching/odor/irritation, headaches, visual changes, shortness of breath, chest pain, abdominal pain, severe nausea/vomiting, or problems with urination or bowel movements unless otherwise stated above. Pertinent History Reviewed:  Reviewed past medical,surgical, social, obstetrical and family history.  Reviewed problem list, medications and allergies. Physical Assessment:   Vitals:   12/04/20 1634  BP: 120/80  Pulse: 89  Weight: 276 lb (125.2 kg)  Body mass index is 41.97 kg/m.           Physical Examination:   General appearance: {appearance:315021::"alert, well appearing, and in no distress"}  Mental status: {pe mental status_general use:313008::"alert, oriented to person, place, and time"}  Skin: warm & dry   Extremities: Edema: Trace    Cardiovascular: normal heart rate noted  Respiratory: normal respiratory effort, no distress  Abdomen: gravid, soft, non-tender  Pelvic: {Blank single:19197::"Cervical exam performed","Cervical exam  deferred"}         Fetal Status:     Movement: Present    Fetal Surveillance Testing today: ***   Chaperone: {Chaperone:19197::"N/A","Latisha Cresenzo","Janet Young","Amanda Andrews","Peggy Dones","Nicole Jones","Angel Neas"}    Results for orders placed or performed in visit on 12/04/20 (from the past 24 hour(s))  POC Urinalysis Dipstick OB   Collection Time: 12/04/20  4:36 PM  Result Value Ref Range   Color, UA     Clarity, UA     Glucose, UA Negative Negative   Bilirubin, UA     Ketones, UA large    Spec Grav, UA     Blood, UA neg    pH, UA     POC,PROTEIN,UA Trace Negative, Trace, Small (1+), Moderate (2+), Large (3+), 4+   Urobilinogen, UA     Nitrite, UA neg    Leukocytes, UA Negative Negative   Appearance     Odor      Assessment & Plan:  High-risk pregnancy: G3P2002 at [redacted]w[redacted]d with an Estimated Date of Delivery: 12/14/20   1) ***, {stable/unstable:60080}  2) ***, {stable/unstable:60080}  Meds: No orders of the defined types were placed in this encounter.   Labs/procedures today: {ob lab/procedures:25214}  Treatment Plan:  ***  Reviewed: {Blank single:19197::"Term","Preterm"} labor symptoms and general obstetric precautions including but not limited to vaginal bleeding, contractions, leaking of fluid and fetal movement were reviewed in detail with the patient.  All questions were answered. {does does not:25387::"Does"} have home bp cuff. Office bp cuff given: {yes/no/default n/a:21102::"not applicable"}. Check bp {weekly daily:25388::"weekly"}, let us know if consistently {pregnant bp:25389::">140 and/or >90"}.  Follow-up: No follow-ups on file.   Future Appointments  Date Time Provider Department Center  12/09/2020 11:10 AM [facility] NURSE [facility]  12/12/2020 10:50 AM Osamu Olguin, Amaryllis Dyke, MD [facility]    Orders Placed This Encounter  Procedures  . POC Urinalysis Dipstick OB   Lazaro Arms  12/04/2020 5:15 PM

## 2020-12-05 ENCOUNTER — Encounter (HOSPITAL_COMMUNITY): Payer: Self-pay

## 2020-12-05 NOTE — Patient Instructions (Signed)
KENI WAFER  12/05/2020   Your procedure is scheduled on:  12/10/2020  Arrive at 3:30 PM at Entrance C on [address] at [facility]  and [employer]. You are invited to use the FREE valet parking or use the Visitor's parking deck.  Pick up the phone at the desk and dial [phone].  Call this number if you have problems the morning of surgery: [phone]  Remember:   Do not eat food:(After Midnight) Desps de medianoche.  Do not drink clear liquids: (6 Hours before arrival) 6 horas ante llegada.  Take these medicines the morning of surgery with A SIP OF WATER:  Take valtrex as prescribed   Do not wear jewelry, make-up or nail polish.  Do not wear lotions, powders, or perfumes. Do not wear deodorant.  Do not shave 48 hours prior to surgery.  Do not bring valuables to the hospital.  [facility] is not   responsible for any belongings or valuables brought to the hospital.  Contacts, dentures or bridgework may not be worn into surgery.  Leave suitcase in the car. After surgery it may be brought to your room.  For patients admitted to the hospital, checkout time is 11:00 AM the day of              discharge.      Please read over the following fact sheets that you were given:     Preparing for Surgery

## 2020-12-07 ENCOUNTER — Other Ambulatory Visit: Payer: Self-pay | Admitting: Obstetrics & Gynecology

## 2020-12-08 ENCOUNTER — Other Ambulatory Visit (HOSPITAL_COMMUNITY): Payer: 59

## 2020-12-08 ENCOUNTER — Encounter (HOSPITAL_COMMUNITY): Admission: RE | Admit: 2020-12-08 | Payer: 59 | Source: Ambulatory Visit

## 2020-12-09 ENCOUNTER — Encounter (HOSPITAL_COMMUNITY)
Admission: RE | Admit: 2020-12-09 | Discharge: 2020-12-09 | Disposition: A | Discharge status: Home / Self Care | Payer: 59 | Source: Ambulatory Visit | Attending: Obstetrics & Gynecology | Admitting: Obstetrics & Gynecology

## 2020-12-09 ENCOUNTER — Other Ambulatory Visit: Payer: Self-pay

## 2020-12-09 ENCOUNTER — Other Ambulatory Visit (HOSPITAL_COMMUNITY)
Admission: RE | Admit: 2020-12-09 | Discharge: 2020-12-09 | Disposition: A | Discharge status: Home / Self Care | Payer: 59 | Source: Ambulatory Visit | Attending: Obstetrics & Gynecology | Admitting: Obstetrics & Gynecology

## 2020-12-09 ENCOUNTER — Other Ambulatory Visit: Payer: 59

## 2020-12-09 DIAGNOSIS — Z01812 Encounter for preprocedural laboratory examination: Secondary | ICD-10-CM | POA: Insufficient documentation

## 2020-12-09 DIAGNOSIS — Z20822 Contact with and (suspected) exposure to covid-19: Secondary | ICD-10-CM | POA: Insufficient documentation

## 2020-12-09 LAB — COMPREHENSIVE METABOLIC PANEL
ALT: 14 U/L (ref 0–44)
AST: 18 U/L (ref 15–41)
Albumin: 2.9 g/dL — ABNORMAL LOW (ref 3.5–5.0)
Alkaline Phosphatase: 113 U/L (ref 38–126)
Anion gap: 10 (ref 5–15)
BUN: <5 mg/dL — ABNORMAL LOW (ref 6–20)
CO2: 22 mmol/L (ref 22–32)
Calcium: 8.6 mg/dL — ABNORMAL LOW (ref 8.9–10.3)
Chloride: 104 mmol/L (ref 98–111)
Creatinine, Ser: 0.57 mg/dL (ref 0.44–1.00)
GFR, Estimated: >60 mL/min (ref >60)
Glucose, Bld: 94 mg/dL (ref 70–99)
Potassium: 3.9 mmol/L (ref 3.5–5.1)
Sodium: 136 mmol/L (ref 135–145)
Total Bilirubin: 0.5 mg/dL (ref 0.3–1.2)
Total Protein: 6.7 g/dL (ref 6.5–8.1)

## 2020-12-09 LAB — CBC
HCT: 40.7 % (ref 36.0–46.0)
Hemoglobin: 13.4 g/dL (ref 12.0–15.0)
MCH: 28 pg (ref 26.0–34.0)
MCHC: 32.9 g/dL (ref 30.0–36.0)
MCV: 85.1 fL (ref 80.0–100.0)
Platelets: 186 10*3/uL (ref 150–400)
RBC: 4.78 MIL/uL (ref 3.87–5.11)
RDW: 15.2 % (ref 11.5–15.5)
WBC: 8 10*3/uL (ref 4.0–10.5)
nRBC: 0 % (ref 0.0–0.2)

## 2020-12-09 LAB — RAPID HIV SCREEN (HIV 1/2 AB+AG)
HIV 1/2 Antibodies: NONREACTIVE
HIV-1 P24 Antigen - HIV24: NONREACTIVE

## 2020-12-09 LAB — TYPE AND SCREEN
ABO/RH(D): A POS
Antibody Screen: NEGATIVE

## 2020-12-09 LAB — SARS CORONAVIRUS 2 (TAT 6-24 HRS): SARS Coronavirus 2: NEGATIVE

## 2020-12-09 NOTE — Patient Instructions (Signed)
ABIOLA BEHRING  12/09/2020   Your procedure is scheduled on:  12/10/2020  Arrive at 3:30 PM at Entrance C on [address] at [facility]  and [employer]. You are invited to use the FREE valet parking or use the Visitor's parking deck.  Pick up the phone at the desk and dial [phone].  Call this number if you have problems the morning of surgery: [phone]  Remember:   Do not eat food:(After Midnight) Desps de medianoche.  Do not drink clear liquids: (After Midnight) Desps de medianoche.  Take these medicines the morning of surgery with A SIP OF WATER:  Valtrex as prescribed   Do not wear jewelry, make-up or nail polish.  Do not wear lotions, powders, or perfumes. Do not wear deodorant.  Do not shave 48 hours prior to surgery.  Do not bring valuables to the hospital.  [facility] is not   responsible for any belongings or valuables brought to the hospital.  Contacts, dentures or bridgework may not be worn into surgery.  Leave suitcase in the car. After surgery it may be brought to your room.  For patients admitted to the hospital, checkout time is 11:00 AM the day of              discharge.      Please read over the following fact sheets that you were given:     Preparing for Surgery

## 2020-12-10 ENCOUNTER — Encounter (HOSPITAL_COMMUNITY)
Admission: AD | Disposition: A | Discharge status: Home / Self Care | Payer: Self-pay | Source: Home / Self Care | Attending: Obstetrics & Gynecology

## 2020-12-10 ENCOUNTER — Encounter (HOSPITAL_COMMUNITY): Payer: Self-pay | Admitting: Obstetrics & Gynecology

## 2020-12-10 ENCOUNTER — Inpatient Hospital Stay (HOSPITAL_COMMUNITY)
Admission: AD | Admit: 2020-12-10 | Discharge: 2020-12-12 | DRG: 787 | Disposition: A | Discharge status: Home / Self Care | Payer: 59 | Attending: Obstetrics & Gynecology | Admitting: Obstetrics & Gynecology

## 2020-12-10 ENCOUNTER — Inpatient Hospital Stay (HOSPITAL_COMMUNITY): Payer: 59 | Admitting: Anesthesiology

## 2020-12-10 ENCOUNTER — Inpatient Hospital Stay (HOSPITAL_COMMUNITY): Payer: 59

## 2020-12-10 ENCOUNTER — Other Ambulatory Visit: Payer: Self-pay

## 2020-12-10 DIAGNOSIS — O24425 Gestational diabetes mellitus in childbirth, controlled by oral hypoglycemic drugs: Secondary | ICD-10-CM | POA: Diagnosis present

## 2020-12-10 DIAGNOSIS — O3663X0 Maternal care for excessive fetal growth, third trimester, not applicable or unspecified: Secondary | ICD-10-CM | POA: Diagnosis present

## 2020-12-10 DIAGNOSIS — A6000 Herpesviral infection of urogenital system, unspecified: Secondary | ICD-10-CM | POA: Diagnosis present

## 2020-12-10 DIAGNOSIS — Z8759 Personal history of other complications of pregnancy, childbirth and the puerperium: Secondary | ICD-10-CM

## 2020-12-10 DIAGNOSIS — O24419 Gestational diabetes mellitus in pregnancy, unspecified control: Secondary | ICD-10-CM | POA: Diagnosis present

## 2020-12-10 DIAGNOSIS — O9832 Other infections with a predominantly sexual mode of transmission complicating childbirth: Secondary | ICD-10-CM | POA: Diagnosis present

## 2020-12-10 DIAGNOSIS — B009 Herpesviral infection, unspecified: Secondary | ICD-10-CM

## 2020-12-10 DIAGNOSIS — Z20822 Contact with and (suspected) exposure to covid-19: Secondary | ICD-10-CM | POA: Diagnosis present

## 2020-12-10 DIAGNOSIS — O9852 Other viral diseases complicating childbirth: Secondary | ICD-10-CM

## 2020-12-10 DIAGNOSIS — Z3A39 39 weeks gestation of pregnancy: Secondary | ICD-10-CM

## 2020-12-10 DIAGNOSIS — O24424 Gestational diabetes mellitus in childbirth, insulin controlled: Secondary | ICD-10-CM

## 2020-12-10 DIAGNOSIS — F419 Anxiety disorder, unspecified: Secondary | ICD-10-CM | POA: Diagnosis present

## 2020-12-10 DIAGNOSIS — O3660X0 Maternal care for excessive fetal growth, unspecified trimester, not applicable or unspecified: Secondary | ICD-10-CM | POA: Diagnosis present

## 2020-12-10 DIAGNOSIS — O09529 Supervision of elderly multigravida, unspecified trimester: Secondary | ICD-10-CM

## 2020-12-10 LAB — GLUCOSE, CAPILLARY
Glucose-Capillary: 83 mg/dL (ref 70–99)
Glucose-Capillary: 92 mg/dL (ref 70–99)

## 2020-12-10 LAB — RPR: RPR Ser Ql: NONREACTIVE

## 2020-12-10 SURGERY — Surgical Case
Anesthesia: Spinal | Wound class: Clean Contaminated

## 2020-12-10 MED ORDER — FENTANYL CITRATE (PF) 100 MCG/2ML IJ SOLN
INTRAMUSCULAR | Status: DC | PRN
  Administered 2020-12-10: 15 ug via INTRATHECAL

## 2020-12-10 MED ORDER — SODIUM CHLORIDE 0.9% FLUSH: 3.0000 mL | INTRAVENOUS | Status: DC | PRN

## 2020-12-10 MED ORDER — ONDANSETRON HCL 4 MG/2ML IJ SOLN: 4.0000 mg | Freq: Three times a day (TID) | INTRAMUSCULAR | Status: DC | PRN

## 2020-12-10 MED ORDER — TRANEXAMIC ACID-NACL 1000-0.7 MG/100ML-% IV SOLN
INTRAVENOUS | Status: DC | PRN
  Administered 2020-12-10: 1000 mg via INTRAVENOUS

## 2020-12-10 MED ORDER — PRENATAL MULTIVITAMIN CH
1.0000 | ORAL_TABLET | Freq: Every day | ORAL | Status: DC
  Administered 2020-12-11 – 2020-12-12 (×2): 1 via ORAL
  Filled 2020-12-10 (×2): qty 1

## 2020-12-10 MED ORDER — DIPHENHYDRAMINE HCL 25 MG PO CAPS: 25.0000 mg | ORAL_CAPSULE | ORAL | Status: DC | PRN

## 2020-12-10 MED ORDER — ACETAMINOPHEN 500 MG PO TABS
1000.0000 mg | ORAL_TABLET | Freq: Four times a day (QID) | ORAL | Status: DC
  Administered 2020-12-10 – 2020-12-12 (×7): 1000 mg via ORAL
  Filled 2020-12-10 (×7): qty 2

## 2020-12-10 MED ORDER — ENOXAPARIN SODIUM 60 MG/0.6ML IJ SOSY
60.0000 mg | PREFILLED_SYRINGE | INTRAMUSCULAR | Status: DC
  Administered 2020-12-11 – 2020-12-12 (×2): 60 mg via SUBCUTANEOUS
  Filled 2020-12-10 (×2): qty 0.6

## 2020-12-10 MED ORDER — LACTATED RINGERS IV SOLN: INTRAVENOUS | Status: DC

## 2020-12-10 MED ORDER — KETOROLAC TROMETHAMINE 30 MG/ML IJ SOLN
30.0000 mg | Freq: Four times a day (QID) | INTRAMUSCULAR | Status: AC
  Administered 2020-12-11 (×3): 30 mg via INTRAVENOUS
  Filled 2020-12-10 (×3): qty 1

## 2020-12-10 MED ORDER — NALBUPHINE HCL 10 MG/ML IJ SOLN: 5.0000 mg | INTRAMUSCULAR | Status: DC | PRN

## 2020-12-10 MED ORDER — IBUPROFEN 600 MG PO TABS
600.0000 mg | ORAL_TABLET | Freq: Four times a day (QID) | ORAL | Status: DC
  Administered 2020-12-11 – 2020-12-12 (×4): 600 mg via ORAL
  Filled 2020-12-10 (×4): qty 1

## 2020-12-10 MED ORDER — NALOXONE HCL 0.4 MG/ML IJ SOLN: 0.4000 mg | INTRAMUSCULAR | Status: DC | PRN

## 2020-12-10 MED ORDER — NALOXONE HCL 4 MG/10ML IJ SOLN
1.0000 ug/kg/h | INTRAVENOUS | Status: DC | PRN
  Filled 2020-12-10: qty 5

## 2020-12-10 MED ORDER — KETOROLAC TROMETHAMINE 30 MG/ML IJ SOLN
INTRAMUSCULAR | Status: AC
  Filled 2020-12-10: qty 1

## 2020-12-10 MED ORDER — SENNOSIDES-DOCUSATE SODIUM 8.6-50 MG PO TABS
2.0000 | ORAL_TABLET | Freq: Every day | ORAL | Status: DC
  Administered 2020-12-11 – 2020-12-12 (×2): 2 via ORAL
  Filled 2020-12-10 (×2): qty 2

## 2020-12-10 MED ORDER — SIMETHICONE 80 MG PO CHEW: 80.0000 mg | CHEWABLE_TABLET | ORAL | Status: DC | PRN

## 2020-12-10 MED ORDER — NALBUPHINE HCL 10 MG/ML IJ SOLN: 5.0000 mg | Freq: Once | INTRAMUSCULAR | Status: DC | PRN

## 2020-12-10 MED ORDER — MORPHINE SULFATE (PF) 0.5 MG/ML IJ SOLN
INTRAMUSCULAR | Status: AC
  Filled 2020-12-10: qty 10

## 2020-12-10 MED ORDER — KETOROLAC TROMETHAMINE 30 MG/ML IJ SOLN: 30.0000 mg | Freq: Four times a day (QID) | INTRAMUSCULAR | Status: AC | PRN

## 2020-12-10 MED ORDER — SCOPOLAMINE 1 MG/3DAYS TD PT72
1.0000 | MEDICATED_PATCH | Freq: Once | TRANSDERMAL | Status: DC
  Administered 2020-12-10: 1.5 mg via TRANSDERMAL

## 2020-12-10 MED ORDER — OXYTOCIN-SODIUM CHLORIDE 30-0.9 UT/500ML-% IV SOLN
2.5000 [IU]/h | INTRAVENOUS | Status: AC
  Administered 2020-12-10: 2.5 [IU]/h via INTRAVENOUS

## 2020-12-10 MED ORDER — SCOPOLAMINE 1 MG/3DAYS TD PT72
MEDICATED_PATCH | TRANSDERMAL | Status: AC
  Filled 2020-12-10: qty 1

## 2020-12-10 MED ORDER — MENTHOL 3 MG MT LOZG: 1.0000 | LOZENGE | OROMUCOSAL | Status: DC | PRN

## 2020-12-10 MED ORDER — PHENYLEPHRINE HCL (PRESSORS) 10 MG/ML IV SOLN
INTRAVENOUS | Status: DC | PRN
  Administered 2020-12-10 (×2): 80 ug via INTRAVENOUS

## 2020-12-10 MED ORDER — CLINDAMYCIN PHOSPHATE 900 MG/50ML IV SOLN
900.0000 mg | INTRAVENOUS | Status: AC
  Administered 2020-12-10: 900 mg via INTRAVENOUS

## 2020-12-10 MED ORDER — BUPIVACAINE IN DEXTROSE 0.75-8.25 % IT SOLN
INTRATHECAL | Status: DC | PRN
  Administered 2020-12-10: 1.65 mL via INTRATHECAL

## 2020-12-10 MED ORDER — KETOROLAC TROMETHAMINE 30 MG/ML IJ SOLN
30.0000 mg | Freq: Four times a day (QID) | INTRAMUSCULAR | Status: AC | PRN
  Administered 2020-12-10: 30 mg via INTRAVENOUS

## 2020-12-10 MED ORDER — MORPHINE SULFATE (PF) 0.5 MG/ML IJ SOLN
INTRAMUSCULAR | Status: DC | PRN
  Administered 2020-12-10: 150 ug via INTRATHECAL

## 2020-12-10 MED ORDER — PHENYLEPHRINE HCL-NACL 20-0.9 MG/250ML-% IV SOLN
INTRAVENOUS | Status: DC | PRN
  Administered 2020-12-10: 60 ug/min via INTRAVENOUS

## 2020-12-10 MED ORDER — DIBUCAINE (PERIANAL) 1 % EX OINT
1.0000 | TOPICAL_OINTMENT | CUTANEOUS | Status: DC | PRN
Start: 2020-12-10 — End: 2020-12-12

## 2020-12-10 MED ORDER — DIPHENHYDRAMINE HCL 25 MG PO CAPS: 25.0000 mg | ORAL_CAPSULE | Freq: Four times a day (QID) | ORAL | Status: DC | PRN

## 2020-12-10 MED ORDER — ONDANSETRON HCL 4 MG/2ML IJ SOLN
INTRAMUSCULAR | Status: DC | PRN
  Administered 2020-12-10: 4 mg via INTRAVENOUS

## 2020-12-10 MED ORDER — NALBUPHINE HCL 10 MG/ML IJ SOLN
5.0000 mg | INTRAMUSCULAR | Status: DC | PRN
Start: 2020-12-10 — End: 2020-12-12

## 2020-12-10 MED ORDER — DIPHENHYDRAMINE HCL 50 MG/ML IJ SOLN: 12.5000 mg | INTRAMUSCULAR | Status: DC | PRN

## 2020-12-10 MED ORDER — CLINDAMYCIN PHOSPHATE 900 MG/50ML IV SOLN
INTRAVENOUS | Status: AC
  Filled 2020-12-10: qty 50

## 2020-12-10 MED ORDER — OXYTOCIN-SODIUM CHLORIDE 30-0.9 UT/500ML-% IV SOLN
INTRAVENOUS | Status: AC
  Filled 2020-12-10: qty 500

## 2020-12-10 MED ORDER — NALBUPHINE HCL 10 MG/ML IJ SOLN
5.0000 mg | Freq: Once | INTRAMUSCULAR | Status: DC | PRN
Start: 2020-12-10 — End: 2020-12-12

## 2020-12-10 MED ORDER — GENTAMICIN SULFATE 40 MG/ML IJ SOLN
5.0000 mg/kg | INTRAVENOUS | Status: AC
  Administered 2020-12-10: 438.4 mg via INTRAVENOUS
  Filled 2020-12-10 (×2): qty 11

## 2020-12-10 MED ORDER — WITCH HAZEL-GLYCERIN EX PADS: 1.0000 "application " | MEDICATED_PAD | CUTANEOUS | Status: DC | PRN

## 2020-12-10 MED ORDER — MEPERIDINE HCL 25 MG/ML IJ SOLN
6.2500 mg | INTRAMUSCULAR | Status: DC | PRN
Start: 2020-12-10 — End: 2020-12-10

## 2020-12-10 MED ORDER — OXYCODONE HCL 5 MG PO TABS: 5.0000 mg | ORAL_TABLET | ORAL | Status: DC | PRN

## 2020-12-10 MED ORDER — SODIUM CHLORIDE 0.9 % IR SOLN
Status: DC | PRN
  Administered 2020-12-10: 1

## 2020-12-10 MED ORDER — COCONUT OIL OIL: 1.0000 "application " | TOPICAL_OIL | Status: DC | PRN

## 2020-12-10 MED ORDER — FENTANYL CITRATE (PF) 100 MCG/2ML IJ SOLN
INTRAMUSCULAR | Status: AC
  Filled 2020-12-10: qty 2

## 2020-12-10 MED ORDER — STERILE WATER FOR IRRIGATION IR SOLN
Status: DC | PRN
  Administered 2020-12-10: 1000 mL

## 2020-12-10 MED ORDER — POVIDONE-IODINE 10 % EX SWAB
2.0000 "application " | Freq: Once | CUTANEOUS | Status: AC
  Administered 2020-12-10: 2 via TOPICAL

## 2020-12-10 MED ORDER — SIMETHICONE 80 MG PO CHEW
80.0000 mg | CHEWABLE_TABLET | Freq: Three times a day (TID) | ORAL | Status: DC
  Administered 2020-12-11 – 2020-12-12 (×4): 80 mg via ORAL
  Filled 2020-12-10 (×4): qty 1

## 2020-12-10 MED ORDER — PHENYLEPHRINE 40 MCG/ML (10ML) SYRINGE FOR IV PUSH (FOR BLOOD PRESSURE SUPPORT)
PREFILLED_SYRINGE | INTRAVENOUS | Status: AC
  Filled 2020-12-10: qty 10

## 2020-12-10 MED ORDER — TETANUS-DIPHTH-ACELL PERTUSSIS 5-2.5-18.5 LF-MCG/0.5 IM SUSY: 0.5000 mL | PREFILLED_SYRINGE | Freq: Once | INTRAMUSCULAR | Status: DC

## 2020-12-10 SURGICAL SUPPLY — 38 items
CHLORAPREP W/TINT 26ML (MISCELLANEOUS) ×2 IMPLANT
CLAMP CORD UMBIL (MISCELLANEOUS) IMPLANT
CLOTH BEACON ORANGE TIMEOUT ST (SAFETY) ×2 IMPLANT
DERMABOND ADVANCED (GAUZE/BANDAGES/DRESSINGS) ×1
DERMABOND ADVANCED .7 DNX12 (GAUZE/BANDAGES/DRESSINGS) ×1 IMPLANT
DRSG OPSITE POSTOP 4X10 (GAUZE/BANDAGES/DRESSINGS) ×2 IMPLANT
ELECT REM PT RETURN 9FT ADLT (ELECTROSURGICAL) ×2
ELECTRODE REM PT RTRN 9FT ADLT (ELECTROSURGICAL) ×1 IMPLANT
EXTRACTOR VACUUM BELL STYLE (SUCTIONS) IMPLANT
GLOVE BIOGEL PI IND STRL 7.0 (GLOVE) ×1 IMPLANT
GLOVE BIOGEL PI IND STRL 8 (GLOVE) ×1 IMPLANT
GLOVE BIOGEL PI INDICATOR 7.0 (GLOVE) ×1
GLOVE BIOGEL PI INDICATOR 8 (GLOVE) ×1
GLOVE ECLIPSE 8.0 STRL XLNG CF (GLOVE) ×2 IMPLANT
GOWN STRL REUS W/TWL LRG LVL3 (GOWN DISPOSABLE) ×4 IMPLANT
KIT ABG SYR 3ML LUER SLIP (SYRINGE) ×2 IMPLANT
MAT PREVALON FULL STRYKER (MISCELLANEOUS) ×2 IMPLANT
NEEDLE HYPO 18GX1.5 BLUNT FILL (NEEDLE) ×2 IMPLANT
NEEDLE HYPO 22GX1.5 SAFETY (NEEDLE) ×2 IMPLANT
NEEDLE HYPO 25X5/8 SAFETYGLIDE (NEEDLE) ×2 IMPLANT
NS IRRIG 1000ML POUR BTL (IV SOLUTION) ×2 IMPLANT
PACK C SECTION WH (CUSTOM PROCEDURE TRAY) ×2 IMPLANT
PAD OB MATERNITY 4.3X12.25 (PERSONAL CARE ITEMS) ×2 IMPLANT
PENCIL SMOKE EVAC W/HOLSTER (ELECTROSURGICAL) ×2 IMPLANT
RTRCTR C-SECT PINK 25CM LRG (MISCELLANEOUS) IMPLANT
SUT CHROMIC 0 CT 1 (SUTURE) ×2 IMPLANT
SUT MNCRL 0 VIOLET CTX 36 (SUTURE) ×2 IMPLANT
SUT MONOCRYL 0 CTX 36 (SUTURE) ×4
SUT PLAIN 2 0 (SUTURE)
SUT PLAIN 2 0 XLH (SUTURE) IMPLANT
SUT PLAIN ABS 2-0 CT1 27XMFL (SUTURE) IMPLANT
SUT VIC AB 0 CTX 36 (SUTURE) ×2
SUT VIC AB 0 CTX36XBRD ANBCTRL (SUTURE) ×1 IMPLANT
SUT VIC AB 4-0 KS 27 (SUTURE) IMPLANT
SYR 20CC LL (SYRINGE) ×4 IMPLANT
TOWEL OR 17X24 6PK STRL BLUE (TOWEL DISPOSABLE) ×2 IMPLANT
TRAY FOLEY W/BAG SLVR 14FR LF (SET/KITS/TRAYS/PACK) IMPLANT
WATER STERILE IRR 1000ML POUR (IV SOLUTION) ×2 IMPLANT

## 2020-12-10 NOTE — Anesthesia Postprocedure Evaluation (Signed)
Anesthesia Post Note  Patient: Alphonsus Sias Gries  Procedure(s) Performed: CESAREAN SECTION (N/A )     Patient location during evaluation: PACU Anesthesia Type: Spinal Level of consciousness: oriented and awake and alert Pain management: pain level controlled Vital Signs Assessment: post-procedure vital signs reviewed and stable Respiratory status: spontaneous breathing, respiratory function stable and nonlabored ventilation Cardiovascular status: blood pressure returned to baseline and stable Postop Assessment: no headache, no backache, no apparent nausea or vomiting and spinal receding Anesthetic complications: no   No complications documented.  Last Vitals:  Vitals:   12/10/20 2008 12/10/20 2010  BP:    Pulse: 73 72  Resp: 18 (!) 27  Temp:  36.9 C  SpO2: 100% 100%    Last Pain:  Vitals:   12/10/20 2010  TempSrc: Oral  PainSc: 2    Pain Goal:    LLE Motor Response: Purposeful movement (12/10/20 2010) LLE Sensation: Tingling (12/10/20 2010) RLE Motor Response: Purposeful movement (12/10/20 2010) RLE Sensation: Tingling (12/10/20 2010)     Epidural/Spinal Function Cutaneous sensation: Tingles (12/10/20 2010), Patient able to flex knees: Yes (12/10/20 2010), Patient able to lift hips off bed: No (12/10/20 2010), Back pain beyond tenderness at insertion site: No (12/10/20 2010), Progressively worsening motor and/or sensory loss: No (12/10/20 2010), Bowel and/or bladder incontinence post epidural: No (12/10/20 2010)  Lucretia Kern

## 2020-12-10 NOTE — Discharge Instructions (Signed)

## 2020-12-10 NOTE — Anesthesia Preprocedure Evaluation (Addendum)
Anesthesia Evaluation  Patient identified by MRN, date of birth, ID band Patient awake    Reviewed: Allergy & Precautions, H&P , NPO status , Patient's Chart, lab work & pertinent test results, reviewed documented beta blocker date and time   Airway Mallampati: II  TM Distance: >3 FB Neck ROM: full    Dental no notable dental hx.    Pulmonary    Pulmonary exam normal breath sounds clear to auscultation       Cardiovascular Normal cardiovascular exam Rhythm:regular Rate:Normal     Neuro/Psych Anxiety    GI/Hepatic   Endo/Other  diabetes  Renal/GU      Musculoskeletal   Abdominal   Peds  Hematology  (+) Blood dyscrasia, anemia ,   Anesthesia Other Findings   Reproductive/Obstetrics (+) Pregnancy                             Anesthesia Physical Anesthesia Plan  ASA: II  Anesthesia Plan: Spinal   Post-op Pain Management:    Induction:   PONV Risk Score and Plan: 2  Airway Management Planned: Nasal Cannula and Natural Airway  Additional Equipment: None  Intra-op Plan:   Post-operative Plan:   Informed Consent: I have reviewed the patients History and Physical, chart, labs and discussed the procedure including the risks, benefits and alternatives for the proposed anesthesia with the patient or authorized representative who has indicated his/her understanding and acceptance.       Plan Discussed with: Anesthesiologist  Anesthesia Plan Comments:         Anesthesia Quick Evaluation

## 2020-12-10 NOTE — H&P (Signed)
OBSTETRIC ADMISSION HISTORY AND PHYSICAL  Christine Ritter is a 40 y.o. female G3P2002 with IUP at [redacted]w[redacted]d by L/7 presenting for scheduled primary Cesarean secondary to anticipated LGA infant. She reports +FMs, No LOF, no VB, no blurry vision, headaches or peripheral edema, and RUQ pain.  She plans on breast and formula feeding. She plans on partner vasectomy for birth control. She received her prenatal care at [facility] (late transfer of care at 36w)   Dating: By L/7 --->  Estimated Date of Delivery: 12/14/20  Sono:  @[redacted]w[redacted]d , CWD, normal anatomy, cephalic presentation, 4933g, EFW  Prenatal History/Complications:  - h/o anxiety (no medications) - A2GDM (metformin) - HSV (good adherence to valtrex) - h/o vacuum assisted vaginal delivery  Past Medical History: Past Medical History:  Diagnosis Date  . Anemia   . Anxiety   . Pregnancy     Past Surgical History: Past Surgical History:  Procedure Laterality Date  . BACK SURGERY    . CESAREAN SECTION    . WISDOM TOOTH EXTRACTION      Obstetrical History: OB History    Gravida  3   Para  2   Term  2   Preterm      AB      Living  2     SAB      IAB      Ectopic      Multiple      Live Births  2           Social History Social History   Socioeconomic History  . Marital status: Significant Other    Spouse name: Not on file  . Number of children: Not on file  . Years of education: Not on file  . Highest education level: Not on file  Occupational History  . Not on file  Tobacco Use  . Smoking status: Never Smoker  . Smokeless tobacco: Never Used  Vaping Use  . Vaping Use: Never used  Substance and Sexual Activity  . Alcohol use: Not Currently  . Drug use: Not Currently  . Sexual activity: Yes    Birth control/protection: None  Other Topics Concern  . Not on file  Social History Narrative  . Not on file   Social Determinants of Health   Financial Resource Strain: Low Risk   . Difficulty of  Paying Living Expenses: Not very hard  Food Insecurity: No Food Insecurity  . Worried About 93.5% in the Last Year: Never true  . Ran Out of Food in the Last Year: Never true  Transportation Needs: No Transportation Needs  . Lack of Transportation (Medical): No  . Lack of Transportation (Non-Medical): No  Physical Activity: Insufficiently Active  . Days of Exercise per Week: 1 day  . Minutes of Exercise per Session: 20 min  Stress: No Stress Concern Present  . Feeling of Stress : Not at all  Social Connections: Moderately Isolated  . Frequency of Communication with Friends and Family: Three times a week  . Frequency of Social Gatherings with Friends and Family: Once a week  . Attends Religious Services: Never  . Active Member of Clubs or Organizations: No  . Attends Programme researcher, broadcasting/film/video Meetings: Never  . Marital Status: Living with partner    Family History: Family History  Problem Relation Age of Onset  . Diabetes Mother   . Atrial fibrillation Mother   . Diabetes Maternal Grandmother   . Migraines Father   . Obesity  Brother   . Atrial fibrillation Paternal Grandfather     Allergies: Allergies  Allergen Reactions  . Penicillins Anaphylaxis and Rash    Reaction: Childhood    Medications Prior to Admission  Medication Sig Dispense Refill Last Dose  . aspirin EC 81 MG tablet Take 81 mg by mouth daily. Swallow whole.   12/09/2020 at 2100  . loratadine (CLARITIN) 10 MG tablet Take 10 mg by mouth daily.   12/09/2020 at Unknown time  . metFORMIN (GLUCOPHAGE) 500 MG tablet Take 1 tablet (500 mg total) by mouth daily. 30 tablet 11 12/09/2020 at Unknown time  . Prenatal Vit-Fe Fumarate-FA (PNV PRENATAL PLUS MULTIVITAMIN) 27-1 MG TABS Take 1 tablet by mouth daily.   12/09/2020 at Unknown time  . valACYclovir (VALTREX) 500 MG tablet Take 1 tablet (500 mg total) by mouth 3 (three) times daily. (Patient taking differently: Take 500 mg by mouth 2 (two) times daily.) 90 tablet 3  12/10/2020 at Unknown time     Review of Systems   All systems reviewed and negative except as stated in HPI  Blood pressure 139/85, pulse (!) 101, temperature 98.4 F (36.9 C), temperature source Oral, resp. rate 18, height 5\' 8"  (1.727 m), weight 122.7 kg, last menstrual period 03/09/2020. General appearance: alert, cooperative and appears stated age Lungs: normal WOB Heart: regular rate Abdomen: soft, non-tender Extremities: no sign of DVT Fetal monitoring: 140 on Dopplers    Prenatal labs: ABO, Rh: --/--/A POS (05/03 0911) Antibody: NEG (05/03 0911) Rubella: Immune (10/19 0000) RPR: NON REACTIVE (05/03 0901)  HBsAg: Negative (10/19 0000)  HIV: NON REACTIVE (05/03 0901)  GBS: --07-21-2006 (04/14 1400)  2 hr Glucola abnormal  Genetic screening  wnl Anatomy 12-29-1991 wnl except for LGA  Prenatal Transfer Tool  Maternal Diabetes: Yes:  Diabetes Type:  Insulin/Medication controlled Genetic Screening: Normal Maternal Ultrasounds/Referrals: Normal except for LGA Fetal Ultrasounds or other Referrals:  None Maternal Substance Abuse:  No Significant Maternal Medications:  Metformin, valtrex Significant Maternal Lab Results: Group B Strep negative, H/o HSV (valtrex in pregnancy)  Results for orders placed or performed during the hospital encounter of 12/10/20 (from the past 24 hour(s))  Glucose, capillary   Collection Time: 12/10/20  3:55 PM  Result Value Ref Range   Glucose-Capillary 83 70 - 99 mg/dL    Patient Active Problem List   Diagnosis Date Noted  . Fetal macrosomia 12/10/2020  . Family history of malignant neoplasm of breast 11/20/2020  . Supervision of high-risk pregnancy 11/19/2020  . Gestational diabetes 11/19/2020    Assessment/Plan:  Christine Ritter is a 40 y.o. G3P2002 at [redacted]w[redacted]d here for scheduled primary Cesarean for anticipated LGA infant.  #Scheduled Primary Cesarean: The risks of cesarean section were discussed with the patient including but were not limited  to: bleeding which may require transfusion or reoperation; infection which may require antibiotics; injury to bowel, bladder, ureters or other surrounding organs; injury to the fetus; need for additional procedures including hysterectomy in the event of a life-threatening hemorrhage; placental abnormalities wth subsequent pregnancies, incisional problems, thromboembolic phenomenon and other postoperative/anesthesia complications. The patient concurred with the proposed plan, giving informed written consent for the procedure.  Patient will remain NPO for procedure. Anesthesia and OR aware.  Preoperative prophylactic antibiotics (clindamycin+gentamycin) and SCDs ordered on call to the OR.  To OR when ready.  #Pain: Per anesthesia #FWB: 140 on dopplers #ID: GBS negative #MOF: breast & formula #MOC: partner vasectomy #Circ:  Desired #A2GDM: metformin in pregnancy. Plan for  FGB on POD#1. #HSV: good adherence to valtrex in pregnancy. #Anxiety: plan for SW consult in postpartum period.  Sheila Oats, MD OB Fellow, Faculty Practice 12/10/2020 5:35 PM

## 2020-12-10 NOTE — Discharge Summary (Shared)
Postpartum Discharge Summary  Date of Service updated***    Patient Name: Christine Ritter DOB: 03/21/1981 MRN: [id number]  Date of admission: 12/10/2020 Delivery date:12/10/2020  Delivering provider: Florian Buff  Date of discharge: 12/10/2020  Admitting diagnosis: Fetal macrosomia [O36.60X0] Intrauterine pregnancy: [redacted]w[redacted]d    Secondary diagnosis:  Principal Problem:   Cesarean delivery delivered Active Problems:   Gestational diabetes   Fetal macrosomia   Anxiety   History of vacuum extraction assisted delivery  Additional problems: as noted above    Discharge diagnosis: Cesarean delivery delivered                         Post partum procedures:{Postpartum procedures:23558} Augmentation: N/A Complications: None  Hospital course: Sceduled C/S   40y.o. yo G3P3003 at 349w3das admitted to the hospital 12/10/2020 for scheduled cesarean section with the following indication:Macrosomia.Delivery details are as follows:  Membrane Rupture Time/Date: 6:05 PM ,12/10/2020   Delivery Method:C-Section, Low Transverse  Details of operation can be found in separate operative note.  Patient had an uncomplicated*** postpartum course.  She is ambulating, tolerating a regular diet, passing flatus, and urinating well. Patient is discharged home in stable condition on  12/10/20        Newborn Data: Birth date:12/10/2020  Birth time:6:05 PM  Gender:Female  Living status:Living  Apgars:8 ,9  Weight:4300 g     Magnesium Sulfate received: No BMZ received: No Rhophylac:N/A MMR:N/A T-DaP:Given prenatally Flu: {F{WFU:93235}ffered prior to discharge Transfusion:{Transfusion received:30440034}  Physical exam  Vitals:   12/10/20 1930 12/10/20 1936 12/10/20 1945 12/10/20 1952  BP: (!) 115/47  (!) 123/54   Pulse: 70 73 66 64  Resp: 18 (!) 21 14 12   Temp:      TempSrc:      SpO2: 100% 100% 100% 100%  Weight:      Height:       General: {Exam; general:21111117} Lochia: {Desc;  appropriate/inappropriate:30686::"appropriate"} Uterine Fundus: {Desc; firm/soft:30687} Incision: {Exam; incision:21111123} DVT Evaluation: {Exam; dvt:2111122} Labs: Lab Results  Component Value Date   WBC 8.0 12/09/2020   HGB 13.4 12/09/2020   HCT 40.7 12/09/2020   MCV 85.1 12/09/2020   PLT 186 12/09/2020   CMP Latest Ref Rng & Units 12/09/2020  Glucose 70 - 99 mg/dL 94  BUN 6 - 20 mg/dL <5(L)  Creatinine 0.44 - 1.00 mg/dL 0.57  Sodium 135 - 145 mmol/L 136  Potassium 3.5 - 5.1 mmol/L 3.9  Chloride 98 - 111 mmol/L 104  CO2 22 - 32 mmol/L 22  Calcium 8.9 - 10.3 mg/dL 8.6(L)  Total Protein 6.5 - 8.1 g/dL 6.7  Total Bilirubin 0.3 - 1.2 mg/dL 0.5  Alkaline Phos 38 - 126 U/L 113  AST 15 - 41 U/L 18  ALT 0 - 44 U/L 14   Edinburgh Score: No flowsheet data found.   After visit meds:  Allergies as of 12/10/2020      Reactions   Penicillins Anaphylaxis, Rash   Reaction: Childhood    Med Rec must be completed [facility] to [facility]        Discharge home in stable condition Infant Feeding: breast & bottle Infant Disposition:home with mother Discharge instruction: per After Visit Summary and Postpartum booklet. Activity: Advance as tolerated. Pelvic rest for 6 weeks.  Diet: routine diet Future Appointments:No future appointments. Follow [facility] Visit: [facility] GoAstrid DraftsPlease schedule this patient for a In person postpartum visit  in 6 weeks with the following provider: Any provider. 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

## 2020-12-10 NOTE — Transfer of Care (Signed)
Immediate Anesthesia Transfer of Care Note  Patient: Alphonsus Sias Gude  Procedure(s) Performed: CESAREAN SECTION (N/A )  Patient Location: PACU  Anesthesia Type:Spinal  Level of Consciousness: awake, alert  and oriented  Airway & Oxygen Therapy: Patient Spontanous Breathing  Post-op Assessment: Report given to RN and Post -op Vital signs reviewed and stable  Post vital signs: Reviewed and stable  Last Vitals:  Vitals Value Taken Time  BP 93/81 12/10/20 1850  Temp    Pulse 70 12/10/20 1852  Resp 16 12/10/20 1852  SpO2 100 % 12/10/20 1852  Vitals shown include unvalidated device data.  Last Pain:  Vitals:   12/10/20 1547  TempSrc: Oral  PainSc: 0-No pain         Complications: No complications documented.

## 2020-12-10 NOTE — Anesthesia Procedure Notes (Signed)
Spinal  Patient location during procedure: OR Start time: 12/10/2020 5:40 PM End time: 12/10/2020 5:49 PM Reason for block: surgical anesthesia Staffing Anesthesiologist: Bethena Midget, MD Preanesthetic Checklist Completed: patient identified, IV checked, site marked, risks and benefits discussed, surgical consent, monitors and equipment checked, pre-op evaluation and timeout performed Spinal Block Patient position: sitting Prep: DuraPrep Patient monitoring: heart rate, cardiac monitor, continuous pulse ox and blood pressure Approach: midline Location: L3-4 Injection technique: single-shot Needle Needle type: Sprotte  Needle gauge: 24 G Needle length: 9 cm Assessment Sensory level: T4 Events: CSF return

## 2020-12-10 NOTE — Op Note (Signed)
Christine Ritter PROCEDURE DATE: 12/10/2020  PREOPERATIVE DIAGNOSES: Intrauterine pregnancy at [redacted]w[redacted]d weeks gestation; fetal macrosomia  POSTOPERATIVE DIAGNOSES: The same  PROCEDURE: Primary Low Transverse Cesarean Section  SURGEON:  Dr. Duane Lope  ASSISTANT:  Dr. Lynnda Shields  ANESTHESIOLOGY TEAM: Anesthesiologist: Bethena Midget, MD CRNA: Junious Silk, CRNA; Elgie Congo, CRNA  INDICATIONS: Christine Ritter is a 40 y.o. G3P2002 at [redacted]w[redacted]d here for cesarean section secondary to the indications listed under preoperative diagnoses; please see preoperative note for further details.  The risks of surgery were discussed with the patient including but were not limited to: bleeding which may require transfusion or reoperation; infection which may require antibiotics; injury to bowel, bladder, ureters or other surrounding organs; injury to the fetus; need for additional procedures including hysterectomy in the event of a life-threatening hemorrhage; formation of adhesions; placental abnormalities wth subsequent pregnancies; incisional problems; thromboembolic phenomenon and other postoperative/anesthesia complications.  The patient concurred with the proposed plan, giving informed written consent for the procedure.    FINDINGS:  Viable female infant in cephalic presentation.  Apgars 8 and 9.  Clear amniotic fluid.  Intact placenta, three vessel cord.  Normal uterus, fallopian tubes and ovaries bilaterally.  ANESTHESIA: Spinal INTRAVENOUS FLUIDS: 1,000 ml   ESTIMATED BLOOD LOSS: 419 ml URINE OUTPUT:  100 ml SPECIMENS: Placenta sent to L&D COMPLICATIONS: None immediate  PROCEDURE IN DETAIL:  The patient preoperatively received intravenous antibiotics (clindamycin, gentamycin) and had sequential compression devices applied to her lower extremities.  She was then taken to the operating room where spinal anesthesia was administered and was found to be adequate. She was then placed in a dorsal  supine position with a leftward tilt, and prepped and draped in a sterile manner. A foley catheter was placed into her bladder and attached to constant gravity.  After an adequate timeout was performed, a Pfannenstiel skin incision was made with scalpel and carried through to the underlying layer of fascia. The fascia was incised in the midline, and this incision was extended bilaterally bluntly. The rectus muscles were separated in the midline and the peritoneum was entered bluntly. The bladder blade was introduced into the abdominal cavity.  Attention was turned to the lower uterine segment where a low transverse hysterotomy was made with a scalpel and extended bilaterally bluntly.  The infant was successfully delivered, the cord was clamped and cut after one minute, and the infant was handed over to the awaiting neonatology team. Uterine massage was then administered, and the placenta delivered intact with a three-vessel cord. TXA was administered given fetal macrosomia. The uterus was exteriorized and then cleared of clots and debris.  The hysterotomy was closed with 0 Monocryl in a running locked fashion, and a vertical imbricating layer was also placed with 0 Monocryl. Several figure-of-eight 0 Monocryl serosal stitches were placed to help with hemostasis. The uterus was returned to the abdomen, and hemostasis was confirmed on all surfaces. The fascia was then closed using 0 Vicryl in a running fashion.  The subcutaneous layer was irrigated, and reapproximated with 2-0 plain gut interrupted stitches, and the skin was closed with a 4-0 Vicryl subcuticular stitch. The patient tolerated the procedure well. Sponge, instrument and needle counts were correct x 3.  She was taken to the recovery room in stable condition.   Sheila Oats, MD OB Fellow, [facility] 12/10/2020 7:39 PM

## 2020-12-10 NOTE — Lactation Note (Signed)
This note was copied from a baby's chart. Lactation Consultation Note  Patient Name: Boy Vianna Venezia EXBMW'U Date: 12/10/2020 Reason for consult: Initial assessment (C/S delivery) Age:39 hours P3, term female infant LGA greater than 9 lbs. Infant had 3 voids and 3 stools since birth. This was mom's third time latching infant at the breast, infant BF in recovery, 2nd time 11 minutes and this feeding 30 minutes. LC entered room, mom had infant latched in cross cradle position on her right breast swallows observe infant was towards end of feeding, breastfeeding for 30 minutes. Per mom, she only feels tug with latch no pain.  Mom knows to breastfeed infant according to cues, 8 to 12 or more times within 24 hours, STS. LC discussed infant's input and output with parents. Mom knows to call RN or LC if she has any BF questions, concerns or need assistance with latching infant at the breast.  Mom made aware of O/P services, breastfeeding support groups, community resources, and our phone # for post-discharge questions.   Maternal Data Has patient been taught Hand Expression?: Yes Does the patient have breastfeeding experience prior to this delivery?: Yes How long did the patient breastfeed?: Per mom ,she BF 1st child -1 month and 2nd child for 3 months who is currently 80 years old.  Feeding Mother's Current Feeding Choice: Breast Milk  LATCH Score Latch: Grasps breast easily, tongue down, lips flanged, rhythmical sucking.  Audible Swallowing: Spontaneous and intermittent  Type of Nipple: Everted at rest and after stimulation  Comfort (Breast/Nipple): Soft / non-tender  Hold (Positioning): No assistance needed to correctly position infant at breast.  LATCH Score: 10   Lactation Tools Discussed/Used    Interventions Interventions: Breast feeding basics reviewed;Skin to skin;Breast compression;Education  Discharge Pump: Personal WIC Program: Yes  Consult Status Consult Status:  Follow-up Date: 12/11/20 Follow-up type: In-patient    Danelle Earthly 12/10/2020, 11:54 PM

## 2020-12-11 ENCOUNTER — Encounter (HOSPITAL_COMMUNITY): Payer: Self-pay | Admitting: Obstetrics & Gynecology

## 2020-12-11 DIAGNOSIS — O09529 Supervision of elderly multigravida, unspecified trimester: Secondary | ICD-10-CM

## 2020-12-11 LAB — CBC
HCT: 35.8 % — ABNORMAL LOW (ref 36.0–46.0)
Hemoglobin: 11.7 g/dL — ABNORMAL LOW (ref 12.0–15.0)
MCH: 28.2 pg (ref 26.0–34.0)
MCHC: 32.7 g/dL (ref 30.0–36.0)
MCV: 86.3 fL (ref 80.0–100.0)
Platelets: 148 10*3/uL — ABNORMAL LOW (ref 150–400)
RBC: 4.15 MIL/uL (ref 3.87–5.11)
RDW: 15.2 % (ref 11.5–15.5)
WBC: 9.5 10*3/uL (ref 4.0–10.5)
nRBC: 0 % (ref 0.0–0.2)

## 2020-12-11 LAB — GLUCOSE, CAPILLARY: Glucose-Capillary: 118 mg/dL — ABNORMAL HIGH (ref 70–99)

## 2020-12-11 NOTE — Progress Notes (Signed)
Subjective: Postpartum Day #1: Cesarean Delivery Patient reports tolerating PO; foley still in place; breastfeeding going well (will breast/bottle at home); husband has appt for vasectomy; had potato chips to eat at approx 0100; desires circ for her son- consented and note put on baby's chart  Objective: Vital signs in last 24 hours: Temp:  [97.7 F (36.5 C)-98.6 F (37 C)] 98.2 F (36.8 C) (05/05 0530) Pulse Rate:  [61-101] 61 (05/05 0125) Resp:  [11-27] 18 (05/05 0530) BP: (93-139)/(46-85) 112/60 (05/05 0125) SpO2:  [96 %-100 %] 97 % (05/05 0530) Weight:  [122.7 kg] 122.7 kg (05/04 1547)  Physical Exam:  General: alert, cooperative and no distress Lochia: appropriate Uterine Fundus: firm Incision: honeycomb intact and dry DVT Evaluation: No evidence of DVT seen on physical exam.  Recent Labs    12/09/20 0901 12/11/20 0626  HGB 13.4 11.7*  HCT 40.7 35.8*   FBS: 118  Assessment/Plan: Status post Cesarean section. Doing well postoperatively.  Continue current care with desire for d/c tomorrow. Plan on 2hr GTT at [facility] visit.   Arabella Merles CNM 12/11/2020, 8:42 AM

## 2020-12-11 NOTE — Social Work (Signed)
CSW received consult for hx of Anxiety.  CSW met with MOB to offer support and complete assessment.     CSW introduced self and role. CSW observed FOB in recliner holding infant. CSW offered to return to speak to MOB in private, MOB declined and stated FOB could remain in room. CSW informed MOB of reason for consult. MOB was observed smiling, pleasant and engaged. MOB reported she was diagnosed with anxiety in 2010. MOB shared she experienced symptoms on and off over the years. MOB disclosed she was prescribed a medication when diagnosed, but she did not like how it made her feel. MOB denies ever engaging in therapy. MOB stated her pregnancy went well and did not experience any anxiousness. MOB expressed she is currently feeling "really good." MOB identified FOB, her immediate family and FOB family as supports. MOB denies any current SI or HI.  CSW provided education regarding the baby blues period versus PPD and offered resources. MOB declined resources. CSW provided the New Mom Checklist and encouraged MOB to self evaluate and contact a medical professional if symptoms are noted at any time.  CSW provided review of Sudden Infant Death Syndrome (SIDS) precautions. MOB reported she has all essentials for infant, including a crib and car seat. MOB identified [facility] for follow-up care and denies any barriers to care. MOB stated she has no additional needs at this time.   CSW identifies no further need for intervention and no barriers to discharge at this time.  Arval Brandstetter, LCSWA Clinical Social Work [facility] [phone] 

## 2020-12-12 ENCOUNTER — Other Ambulatory Visit: Payer: 59 | Admitting: Obstetrics & Gynecology

## 2020-12-12 MED ORDER — IBUPROFEN 600 MG PO TABS: 600.0000 mg | ORAL_TABLET | Freq: Four times a day (QID) | ORAL | 0 refills | Status: DC

## 2020-12-12 MED ORDER — OXYCODONE HCL 5 MG PO TABS: 5.0000 mg | ORAL_TABLET | ORAL | 0 refills | Status: DC | PRN

## 2020-12-12 NOTE — Lactation Note (Addendum)
This note was copied from a baby's chart. Lactation Consultation Note  Patient Name: Christine Ritter UEAVW'U Date: 12/12/2020 Reason for consult: Follow-up assessment;Term;Infant weight loss (LGA infant greater than 9 lbs, -2% weight loss.) Age:39 hours P3, Per mom she feels breastfeeding is going well, most feeding today has been 15 to 30 minutes in length. Per dad, infant had 6 voids and 16 stools since birth. Infant is currently cluster feeding and mom will continue to breastfeed infant according to feeding cues.  LC enter the room, mom was breastfeeding infant on her right breast using the cradle hold position, LC did not assist with latch, infant was still breastfeeding after 12 minutes when LC left the room.  Mom doesn't have any questions or concerns for [facility] regarding breastfeeding at this time.  Maternal Data    Feeding Mother's Current Feeding Choice: Breast Milk  LATCH Score Latch: Grasps breast easily, tongue down, lips flanged, rhythmical sucking.  Audible Swallowing: A few with stimulation  Type of Nipple: Everted at rest and after stimulation  Comfort (Breast/Nipple): Filling, red/small blisters or bruises, mild/mod discomfort  Hold (Positioning): No assistance needed to correctly position infant at breast.  LATCH Score: 8   Lactation Tools Discussed/Used    Interventions Interventions: Breast compression;Skin to skin  Discharge    Consult Status Consult Status: Follow-up Date: 12/12/20 Follow-up type: In-patient    Danelle Earthly 12/12/2020, 12:14 AM

## 2020-12-12 NOTE — Discharge Summary (Signed)
Postpartum Discharge Summary      Patient Name: Christine Ritter DOB: [birth year] MRN: [id number]  Date of admission: 12/10/2020 Delivery date:12/10/2020  Delivering provider: Florian Ritter  Date of discharge: 12/12/2020  Admitting diagnosis: Fetal macrosomia [O36.60X0] Cesarean delivery delivered [O82] Intrauterine pregnancy: 101w3d    Secondary diagnosis:  Principal Problem:   Cesarean delivery delivered Active Problems:   Gestational diabetes   Fetal macrosomia   Anxiety   History of vacuum extraction assisted delivery   AMA (advanced maternal age) multigravida 35+  Additional problems:     Discharge diagnosis: Term Pregnancy Delivered                                              Post partum procedures:none Augmentation: n/a Complications: None  Hospital course: Sceduled C/S   40y.o. yo G3P3003 at 369w3das admitted to the hospital 12/10/2020 for scheduled cesarean section with the following indication:Elective Primary.Delivery details are as follows:  Membrane Rupture Time/Date: 6:05 PM ,12/10/2020   Delivery Method:C-Section, Low Transverse  Details of operation can be found in separate operative note.  Patient had an uncomplicated postpartum course.  She is ambulating, tolerating a regular diet, passing flatus, and urinating well. Patient is discharged home in stable condition on  12/12/20        Newborn Data: Birth date:12/10/2020  Birth time:6:05 PM  Gender:Female  Living status:Living  Apgars:8 ,9  Weight:4300 g     Magnesium Sulfate received: No BMZ received: No Rhophylac:N/A MMR:N/A T-DaP:Given prenatally Flu: No Transfusion:No  Physical exam  Vitals:   12/11/20 0934 12/11/20 1312 12/11/20 1956 12/12/20 0543  BP: (!) 112/50 113/80 (!) 117/55 128/81  Pulse: 61 61 (!) 59 63  Resp: 18 18 18 18   Temp: 99 F (37.2 C) 97.9 F (36.6 C) 98.1 F (36.7 C) 98.1 F (36.7 C)  TempSrc: Oral Oral Oral Oral  SpO2: 99% 99% 98% 99%  Weight:      Height:       General:  alert, cooperative and no distress Lochia: appropriate Uterine Fundus: firm Incision: Healing well with no significant drainage, No significant erythema, Dressing is clean, dry, and intact DVT Evaluation: No evidence of DVT seen on physical exam. Negative Homan's sign. No cords or calf tenderness. Labs: Lab Results  Component Value Date   WBC 9.5 12/11/2020   HGB 11.7 (L) 12/11/2020   HCT 35.8 (L) 12/11/2020   MCV 86.3 12/11/2020   PLT 148 (L) 12/11/2020   CMP Latest Ref Rng & Units 12/09/2020  Glucose 70 - 99 mg/dL 94  BUN 6 - 20 mg/dL <5(L)  Creatinine 0.44 - 1.00 mg/dL 0.57  Sodium 135 - 145 mmol/L 136  Potassium 3.5 - 5.1 mmol/L 3.9  Chloride 98 - 111 mmol/L 104  CO2 22 - 32 mmol/L 22  Calcium 8.9 - 10.3 mg/dL 8.6(L)  Total Protein 6.5 - 8.1 g/dL 6.7  Total Bilirubin 0.3 - 1.2 mg/dL 0.5  Alkaline Phos 38 - 126 U/L 113  AST 15 - 41 U/L 18  ALT 0 - 44 U/L 14   Edinburgh Score: Edinburgh Postnatal Depression Scale Screening Tool 12/10/2020  I have been able to laugh and see the funny side of things. 0  I have looked forward with enjoyment to things. 0  I have blamed myself unnecessarily when things went wrong. 1  I have been anxious or worried for no good reason. 1  I have felt scared or panicky for no good reason. 0  Things have been getting on top of me. 0  I have been so unhappy that I have had difficulty sleeping. 0  I have felt sad or miserable. 0  I have been so unhappy that I have been crying. 0  The thought of harming myself has occurred to me. 0  Edinburgh Postnatal Depression Scale Total 2     After visit meds:  Allergies as of 12/12/2020      Reactions   Penicillins Anaphylaxis, Rash   Reaction: Childhood      Medication List    STOP taking these medications   aspirin EC 81 MG tablet   loratadine 10 MG tablet Commonly known as: CLARITIN   metFORMIN 500 MG tablet Commonly known as: GLUCOPHAGE   valACYclovir 500 MG tablet Commonly known as:  VALTREX     TAKE these medications   ibuprofen 600 MG tablet Commonly known as: ADVIL Take 1 tablet (600 mg total) by mouth every 6 (six) hours.   oxyCODONE 5 MG immediate release tablet Commonly known as: Oxy IR/ROXICODONE Take 1-2 tablets (5-10 mg total) by mouth every 4 (four) hours as needed for moderate pain.   PNV Prenatal Plus Multivitamin 27-1 MG Tabs Take 1 tablet by mouth daily.        Discharge home in stable condition Infant Feeding: Breast Infant Disposition:home with mother Discharge instruction: per After Visit Summary and Postpartum booklet. Activity: Advance as tolerated. Pelvic rest for 6 weeks.  Diet: routine diet Future Appointments: Future Appointments  Date Time Provider [place]  12/18/2020  8:50 AM Christine Ritter, Christine Clause, MD [facility]   Follow up Visit:  Follow-up Information    Christine Ritter Follow up on 12/18/2020.   Specialty: Obstetrics and Gynecology Why: for incision check Contact information: [address] Christine Ritter [phone]               12/12/2020 Christine Ritter, CNM

## 2020-12-12 NOTE — Lactation Note (Signed)
This note was copied from a baby's chart. Lactation Consultation Note  Patient Name: Christine Ritter VVOHY'W Date: 12/12/2020 Reason for consult: Follow-up assessment;Term Age:39 hours   P3 mother whose infant is now 6 hours old.  This is a term baby at 39+3 weeks.  Mother has breast feeding experience.  Mother has been primarily breast feeding and has now started some formula supplementation with Similac 20 calorie formula.  Mother had no questions/concerns related to breast feeding.  Last LATCH score was an 8.  Baby has been voiding/stooling well.  Baby was in the nursery receiving a circumcision when I arrived and returned during my visit.  Discussed the possibility of poor feeding due to sleepiness from Tylenol today.  Explained that baby may awaken and desire to feed more frequently during the night tonight.  Mother verbalized understanding.    Engorgement prevention/treatment reviewed.  Provided a manual pump with instructions for use.  #24 flange size is appropriate.  Also provided coconut oil for comfort.  Mother has our OP phone number for any concerns after discharge.   Maternal Data    Feeding Mother's Current Feeding Choice: Breast Milk Nipple Type: Regular  LATCH Score                    Lactation Tools Discussed/Used    Interventions    Discharge Discharge Education: Engorgement and breast care  Consult Status Consult Status: Complete Date: 12/12/20 Follow-up type: Call as needed    Christine Ritter R Zariah Jost 12/12/2020, 11:25 AM

## 2020-12-15 ENCOUNTER — Other Ambulatory Visit: Payer: Self-pay | Admitting: Obstetrics & Gynecology

## 2020-12-15 ENCOUNTER — Telehealth: Payer: Self-pay | Admitting: Obstetrics & Gynecology

## 2020-12-15 MED ORDER — IBUPROFEN 600 MG PO TABS: 600.0000 mg | ORAL_TABLET | Freq: Four times a day (QID) | ORAL | 0 refills | Status: DC

## 2020-12-15 MED ORDER — OXYCODONE HCL 5 MG PO TABS: 5.0000 mg | ORAL_TABLET | ORAL | 0 refills | Status: DC | PRN

## 2020-12-15 NOTE — Telephone Encounter (Signed)
Pt aware refills were sent to pharmacy. JSY

## 2020-12-15 NOTE — Telephone Encounter (Signed)
Patient wants her Oxycodone and Ibuprofen prescriptions sent over to [facility] in [place].

## 2020-12-18 ENCOUNTER — Ambulatory Visit (INDEPENDENT_AMBULATORY_CARE_PROVIDER_SITE_OTHER): Payer: 59 | Admitting: Women's Health

## 2020-12-18 ENCOUNTER — Other Ambulatory Visit: Payer: 59

## 2020-12-18 ENCOUNTER — Other Ambulatory Visit: Payer: Self-pay

## 2020-12-18 ENCOUNTER — Encounter: Payer: Self-pay | Admitting: Women's Health

## 2020-12-18 VITALS — BP 126/82 | HR 72 | Ht 68.75 in | Wt 247.0 lb

## 2020-12-18 DIAGNOSIS — Z8659 Personal history of other mental and behavioral disorders: Secondary | ICD-10-CM

## 2020-12-18 DIAGNOSIS — Z4889 Encounter for other specified surgical aftercare: Secondary | ICD-10-CM

## 2020-12-18 NOTE — Progress Notes (Signed)
   GYN VISIT Patient name: Christine Ritter MRN [id number]  Date of birth: 1981-05-05 Chief Complaint:   Routine Post Op (Incision check and pp depression screen)  History of Present Illness:   Christine Ritter is a 40 y.o. G32P3003 Caucasian female 8d s/p PLTCS being seen today for incision and mood check. EPDS=1, denies any problems w/ PPD/anxiety. Breast and bottlefeeding. Sometimes feels like incision is opening up.  Husband plans vasectomy.  Depression screen [facility] 2/9 11/20/2020  Decreased Interest 0  Down, Depressed, Hopeless 0  PHQ - 2 Score 0  Altered sleeping 1  Tired, decreased energy 1  Change in appetite 0  Feeling bad or failure about yourself  0  Trouble concentrating 0  Moving slowly or fidgety/restless 0  Suicidal thoughts 0  PHQ-9 Score 2    No LMP recorded. (Menstrual status: Lactating). Review of Systems:   Pertinent items are noted in HPI Denies fever/chills, dizziness, headaches, visual disturbances, fatigue, shortness of breath, chest pain, abdominal pain, vomiting, abnormal vaginal discharge/itching/odor/irritation, problems with periods, bowel movements, urination, or intercourse unless otherwise stated above.  Pertinent History Reviewed:  Reviewed past medical,surgical, social, obstetrical and family history.  Reviewed problem list, medications and allergies. Physical Assessment:   Vitals:   12/18/20 0953  BP: 126/82  Pulse: 72  Weight: 247 lb (112 kg)  Height: 5' 8.75" (1.746 m)  Body mass index is 36.74 kg/m.       Physical Examination:   General appearance: alert, well appearing, and in no distress  Mental status: alert, oriented to person, place, and time  Skin: warm & dry   Cardiovascular: normal heart rate noted  Respiratory: normal respiratory effort, no distress  Abdomen: soft, non-tender, honeycomb dressing removed, dermabond had skin glued to skin, so carefully separated this along almost entire length of incision- this is likely the 'opening'  sensation she had been feeling. Healing well, no s/s infection  Pelvic: examination not indicated  Extremities: no edema   Chaperone: N/A    No results found for this or any previous visit (from the past 24 hour(s)).  Assessment & Plan:  1) 8d s/p PLTCS> breast & bottlefeeding  2) Incision check> healing well, honeycomb removed. Keep clean and dry. Reviewed s/s infection to report  3) Mood check> doing great, EPDS 1  Meds: No orders of the defined types were placed in this encounter.   No orders of the defined types were placed in this encounter.   Return in about 4 weeks (around 01/15/2021) for postpartum visit, CNM, in person.  Cheral Marker CNM, [facility] 12/18/2020 10:09 AM

## 2020-12-18 NOTE — Patient Instructions (Signed)
Tips To Increase Milk Supply Lots of water! Enough so that your urine is clear Plenty of calories, if you're not getting enough calories, your milk supply can decrease Breastfeed/pump often, every 2-3 hours x 20-30mins Fenugreek 3 pills 3 times a day, this may make your urine smell like maple syrup Mother's Milk Tea Lactation cookies, google for the recipe Real oatmeal Body Armor sports drinks Greater Than hydration drink  

## 2021-01-15 ENCOUNTER — Ambulatory Visit (INDEPENDENT_AMBULATORY_CARE_PROVIDER_SITE_OTHER): Payer: 59 | Admitting: Advanced Practice Midwife

## 2021-01-15 ENCOUNTER — Other Ambulatory Visit: Payer: Self-pay

## 2021-01-15 ENCOUNTER — Encounter: Payer: Self-pay | Admitting: Advanced Practice Midwife

## 2021-01-15 DIAGNOSIS — Z803 Family history of malignant neoplasm of breast: Secondary | ICD-10-CM

## 2021-01-15 DIAGNOSIS — Z8632 Personal history of gestational diabetes: Secondary | ICD-10-CM

## 2021-01-15 NOTE — Progress Notes (Signed)
Post Partum Visit Note   Chief Complaint:   Postpartum Care  History of Present Illness:   Christine Ritter is a 40 y.o. G20P3003  female being seen today for a postpartum visit. She is 5 weeks postpartum following a primary cesarean section, low transverse incision at 39.3 gestational weeks. IOL: No, . Anesthesia: spinal.  Laceration: n/a.  Complications: none. Inpatient contraception: no.   Pregnancy complicated by GDM, scheduled CS for suspected macrosomia . Tobacco use: no. Substance use disorder: no. Last pap smear: 08/2019 and results were negative per pt report at [facility] . Next pap smear due: 08/2022 Patient's last menstrual period was 01/10/2021 (exact date).  Postpartum course has been uncomplicated. Bleeding  Started period on Saturday . Bowel function is normal. Bladder function is normal. Urinary incontinence? No, fecal incontinence? No Patient is not sexually active. Last sexual activity: prior to [place].  Desired contraception:  vacsectomy consultation on Modnday . Patient does not want a pregnancy in the future.  Desired family size is 3 children.   Upstream - 01/15/21 0842       Pregnancy Intention Screening   Does the patient want to become pregnant in the next year? No    Does the patient's partner want to become pregnant in the next year? No    Would the patient like to discuss contraceptive options today? No      Contraception Wrap Up   Current Method Female Condom    End Method Vasectomy    Contraception Counseling Provided No            The pregnancy intention screening data noted above was reviewed. Potential methods of contraception were discussed. The patient elected to proceed with Abstinence.   Edinburgh Postpartum Depression Screening: Negative  Edinburgh Postnatal Depression Scale - 01/15/21 0841       Edinburgh Postnatal Depression Scale:  In the Past 7 Days   I have been able to laugh and see the funny side of things. 0    I have looked forward with  enjoyment to things. 0    I have blamed myself unnecessarily when things went wrong. 0    I have been anxious or worried for no good reason. 0    I have felt scared or panicky for no good reason. 0    Things have been getting on top of me. 0    I have been so unhappy that I have had difficulty sleeping. 0    I have felt sad or miserable. 1    I have been so unhappy that I have been crying. 0    The thought of harming myself has occurred to me. 0    Edinburgh Postnatal Depression Scale Total 1            Baby's course has been uncomplicated. Baby is feeding by breast and bottle: milk supply adequate. Infant has a pediatrician/family doctor? Yes.  Childcare strategy if returning to work/school: yes.  Pt has material needs met for her and baby: Yes.   Review of Systems:   Pertinent items are noted in HPI Denies Abnormal vaginal discharge w/ itching/odor/irritation, headaches, visual changes, shortness of breath, chest pain, abdominal pain, severe nausea/vomiting, or problems with urination or bowel movements. Pertinent History Reviewed:  Reviewed past medical,surgical, obstetrical and family history.  Reviewed problem list, medications and allergies. OB History  Gravida Para Term Preterm AB Living  3 3 3     3   SAB IAB Ectopic Multiple Live Births  0 3    # Outcome Date GA Lbr Len/2nd Weight Sex Delivery Anes PTL Lv  3 Term 12/10/20 [redacted]w[redacted]d 9 lb 7.7 oz (4.3 kg) M CS-LTranv Spinal  LIV  2 Term 08/14/08 [id number] lb 1 oz (4.111 kg) M Vag-Spont EPI N LIV     Birth Comments: meconium fluid  1 Term 06/01/06 [id number] lb 13 oz (3.997 kg) M Vag-Vacuum EPI N LIV   Physical Assessment:   Vitals:   01/15/21 0839  BP: 107/70  Pulse: 72  Weight: 245 lb (111.1 kg)  Height: 5' 9"  (1.753 m)  Body mass index is 36.18 kg/m.  Objective:  Blood pressure 107/70, pulse 72, height 5' 9"  (1.753 m), weight 245 lb (111.1 kg), last menstrual period 01/10/2021, not currently  breastfeeding.  General:  alert, cooperative, and no distress   Breasts:  negative  Lungs: Normal respiratory effort  Heart:  regular rate and rhythm  Abdomen: soft, non-tender, incision well healed   Vulva:  not evaluated  Vagina: not evaluated  Cervix:  normal  Corpus: Well involuted  Adnexa:  not evaluated  Rectal Exam: no hemorrhoids          No results found for this or any previous visit (from the past 24 hour(s)).  Assessment & Plan:  1) Postpartum exam 2) 5 wks s/p primary cesarean section, low transverse incision 3) breast & bottle feeding 4) Depression screening 5) Contraception counseling 6) GDM--gtt today 7)  Fam hx br ca:  mammogram ordered   Essential components of care per ACOG recommendations:  1.  Mood and well being:  If positive depression screen, discussed and plan developed.  If using tobacco we discussed reduction/cessation and risk of relapse If current substance abuse, we discussed and referral to local resources was offered.   2. Infant care and feeding:  If breastfeeding, discussed returning to work, pumping, breastfeeding-associated pain, guidance regarding return to fertility while lactating if not using another method. If needed, patient was provided with a letter to be allowed to pump q 2-3hrs to support lactation in a private location with access to a refrigerator to store breastmilk.   Recommended that all caregivers be immunized for flu, pertussis and other preventable communicable diseases If pt does not have material needs met for her/baby, referred to local resources for help obtaining these.  3. Sexuality, contraception and birth spacing Provided guidance regarding sexuality, management of dyspareunia, and resumption of intercourse Discussed avoiding interpregnancy interval <6mt42mand recommended birth spacing of 18 months  4. Sleep and fatigue Discussed coping options for fatigue and sleep disruption Encouraged  family/partner/community support of 4 hrs of uninterrupted sleep to help with mood and fatigue  5. Physical recovery  If pt had a C/S, assessed incisional pain and providing guidance on normal vs prolonged recovery If pt had a laceration, perineal healing and pain reviewed.  If urinary or fecal incontinence, discussed management and referred to PT or uro/gyn if indicated  Patient is safe to resume physical activity. Discussed attainment of healthy weight.  6.  Chronic disease management Discussed pregnancy complications if any, and their implications for future childbearing and long-term maternal health. Review recommendations for prevention of recurrent pregnancy complications, such as 17 hydroxyprogesterone caproate to reduce risk for recurrent PTB not applicable, or aspirin to reduce risk of preeclampsia not applicable. Pt had GDM: Yes. If yes, 2hr GTT scheduled: yes. Reviewed medications and non-pregnant dosing including consideration of whether pt is breastfeeding using a reliable resource  such as LactMed: not applicable Referred for f/u w/ PCP or subspecialist providers as indicated: yes  7. Health maintenance Mammogram  Pap smears as indicated  Meds: No orders of the defined types were placed in this encounter.   Follow-up: No follow-ups on file.   Orders Placed This Encounter  Procedures   MM DIGITAL SCREENING BILATERAL   Glucose Tolerance, 2 Hours w/1 Hour       [name] DNP, [facility] [employer], [facility] 01/15/2021 12:04 PM

## 2021-01-16 ENCOUNTER — Encounter: Payer: Self-pay | Admitting: Women's Health

## 2021-01-16 ENCOUNTER — Other Ambulatory Visit: Payer: Self-pay | Admitting: Women's Health

## 2021-01-16 DIAGNOSIS — E7439 Other disorders of intestinal carbohydrate absorption: Secondary | ICD-10-CM

## 2021-01-16 DIAGNOSIS — Z8632 Personal history of gestational diabetes: Secondary | ICD-10-CM | POA: Insufficient documentation

## 2021-01-16 LAB — GLUCOSE TOLERANCE, 2 HOURS W/ 1HR
Glucose, 1 hour: 150 mg/dL (ref 65–179)
Glucose, 2 hour: 132 mg/dL (ref 65–152)
Glucose, Fasting: 103 mg/dL — ABNORMAL HIGH (ref 65–91)

## 2021-03-25 ENCOUNTER — Other Ambulatory Visit: Payer: Self-pay | Admitting: Obstetrics & Gynecology

## 2021-04-12 ENCOUNTER — Encounter (HOSPITAL_COMMUNITY): Payer: Self-pay | Admitting: Emergency Medicine

## 2021-04-12 ENCOUNTER — Emergency Department (HOSPITAL_COMMUNITY)
Admission: EM | Admit: 2021-04-12 | Discharge: 2021-04-12 | Disposition: A | Discharge status: Home / Self Care | Payer: 59 | Attending: Emergency Medicine | Admitting: Emergency Medicine

## 2021-04-12 DIAGNOSIS — M5442 Lumbago with sciatica, left side: Secondary | ICD-10-CM | POA: Insufficient documentation

## 2021-04-12 DIAGNOSIS — M545 Low back pain, unspecified: Secondary | ICD-10-CM | POA: Diagnosis present

## 2021-04-12 HISTORY — DX: Dorsalgia, unspecified: M54.9

## 2021-04-12 MED ORDER — OXYCODONE-ACETAMINOPHEN 5-325 MG PO TABS
1.0000 | ORAL_TABLET | Freq: Once | ORAL | Status: AC
  Administered 2021-04-12: 1 via ORAL
  Filled 2021-04-12: qty 1

## 2021-04-12 MED ORDER — DEXAMETHASONE 4 MG PO TABS
6.0000 mg | ORAL_TABLET | Freq: Once | ORAL | Status: AC
  Administered 2021-04-12: 6 mg via ORAL
  Filled 2021-04-12: qty 2

## 2021-04-12 MED ORDER — NAPROXEN 500 MG PO TABS: 500.0000 mg | ORAL_TABLET | Freq: Two times a day (BID) | ORAL | 0 refills | Status: AC

## 2021-04-12 MED ORDER — KETOROLAC TROMETHAMINE 60 MG/2ML IM SOLN
30.0000 mg | Freq: Once | INTRAMUSCULAR | Status: AC
  Administered 2021-04-12: 30 mg via INTRAMUSCULAR
  Filled 2021-04-12: qty 2

## 2021-04-12 NOTE — Discharge Instructions (Addendum)
Continue taking your Tylenol at home.  In addition you can also take naproxen 500 mg twice daily as needed for pain.  Follow-up with your spine or pain management specialist this week.  Return immediately to the ER if your symptoms worsen if you have new numbness weakness or any additional concerns.

## 2021-04-12 NOTE — ED Provider Notes (Signed)
Emergency Medicine Provider Triage Evaluation Note  Christine Ritter , a 40 y.o. female  was evaluated in triage.  Has significant past medical history of lumbar back pain, L4-S1 discectomy in 2013, followed by orthopedic surgery.  States that in May she started seeing pain management doctor.  Initially put her on amitriptyline which did not work so she is now on gabapentin and baclofen.  She is also been seen by PT.  Had epidural injection with orthopedics on August 1.  States that pain is gradually increased since then and she is now having 10 out of 10 pain when laying down.  Endorses numbness and tingling on the left leg more than the right.  Denies incontinence of bowel or bladder, saddle anesthesia.   Review of Systems  Positive: Lumbar back pain, numbness and tingling in BLE Negative: Falls, Saddle anesthesia, nausea, dysuria, fever, IVDU  Physical Exam  BP 123/70 (BP Location: Left Arm)   Pulse 85   Temp 98.6 F (37 C) (Oral)   Resp 16   SpO2 95%  Gen:   Awake, no distress   Neuro  Alert and oriented x3, no focal neuro deficit, sensation intact bilaterally, motor intact bilaterally MSK:   Moves extremities without difficulty. 5/5 strength in bilateral lower extremities. No TTP of L spine.   Medical Decision Making  Medically screening exam initiated at 9:29 AM.  Appropriate orders placed.  Christine Ritter was informed that the remainder of the evaluation will be completed by another provider, this initial triage assessment does not replace that evaluation, and the importance of remaining in the ED until their evaluation is complete.     Cristopher Peru, PA-C 04/12/21 2574    Cheryll Cockayne, MD 04/12/21 1130

## 2021-04-12 NOTE — ED Provider Notes (Signed)
[facility] EMERGENCY DEPARTMENT Provider Note   CSN: [id number] Arrival date & time: 04/12/21  0850     History Chief Complaint  Patient presents with   Back Pain    Christine Ritter is a 40 y.o. female.  Patient presents with worsening of her chronic lower back pain.  Describes it as pain she had for over 10 years and lower back.  She had surgery for several years ago with some improvement.  Has recurrence of the pain over the course of the last 5 months.  She is seeing a pain specialist and orthopedist but has had exacerbation of the pain over the last week now.  Denies any new numbness or weakness.  Denies any fevers vomiting cough or diarrhea.  Denies any fall or trauma to her recollection.  Describes a sharp pain in the left lower back radiating down the left leg.  Denies bowel or bladder dysfunction.      Past Medical History:  Diagnosis Date   Anemia    Anxiety    Back pain    Pregnancy     Patient Active Problem List   Diagnosis Date Noted   History of gestational diabetes 01/16/2021   Cesarean delivery delivered 12/10/2020   Anxiety 12/10/2020   History of vacuum extraction assisted delivery 12/10/2020   Family history of malignant neoplasm of breast 11/20/2020    Past Surgical History:  Procedure Laterality Date   BACK SURGERY     CESAREAN SECTION     CESAREAN SECTION N/A 12/10/2020   Procedure: CESAREAN SECTION;  Surgeon: Lazaro Arms, MD;  Location: [facility] LD ORS;  Service: Obstetrics;  Laterality: N/A;   WISDOM TOOTH EXTRACTION       OB History     Gravida  3   Para  3   Term  3   Preterm      AB      Living  3      SAB      IAB      Ectopic      Multiple  0   Live Births  3           Family History  Problem Relation Age of Onset   Diabetes Mother    Atrial fibrillation Mother    Diabetes Maternal Grandmother    Migraines Father    Obesity Brother    Atrial fibrillation Paternal Grandfather     Social  History   Tobacco Use   Smoking status: Never   Smokeless tobacco: Never  Vaping Use   Vaping Use: Never used  Substance Use Topics   Alcohol use: Not Currently   Drug use: Not Currently    Home Medications Prior to Admission medications   Medication Sig Start Date End Date Taking? Authorizing Provider  naproxen (NAPROSYN) 500 MG tablet Take 1 tablet (500 mg total) by mouth 2 (two) times daily for 7 days. 04/12/21 04/19/21 Yes Janaki Exley, Eustace Moore, MD  oxyCODONE (OXY IR/ROXICODONE) 5 MG immediate release tablet Take 1-2 tablets (5-10 mg total) by mouth every 4 (four) hours as needed for moderate pain. 12/15/20   Lazaro Arms, MD  Prenatal Vit-Fe Fumarate-FA (PNV PRENATAL PLUS MULTIVITAMIN) 27-1 MG TABS Take 1 tablet by mouth daily.    [provider]  valACYclovir (VALTREX) 500 MG tablet TAKE 1 TABLET BY MOUTH THREE TIMES A DAY 03/25/21   Lazaro Arms, MD    Allergies    Penicillins  Review of  Systems   Review of Systems  Constitutional:  Negative for fever.  HENT:  Negative for ear pain.   Eyes:  Negative for pain.  Respiratory:  Negative for cough.   Cardiovascular:  Negative for chest pain.  Gastrointestinal:  Negative for abdominal pain.  Genitourinary:  Negative for flank pain.  Musculoskeletal:  Positive for back pain.  Skin:  Negative for rash.  Neurological:  Negative for headaches.   Physical Exam Updated Vital Signs BP 123/70 (BP Location: Left Arm)   Pulse 85   Temp 98.6 F (37 C) (Oral)   Resp 16   SpO2 95%   Physical Exam Constitutional:      General: She is not in acute distress.    Appearance: Normal appearance.  HENT:     Head: Normocephalic.     Nose: Nose normal.  Eyes:     Extraocular Movements: Extraocular movements intact.  Cardiovascular:     Rate and Rhythm: Normal rate.  Pulmonary:     Effort: Pulmonary effort is normal.  Musculoskeletal:        General: Normal range of motion.     Cervical back: Normal range of motion.      Comments: Positive straight leg test on left lower extremity.  Otherwise neurovascularly intact bilateral lower extremities.  No saddle anesthesia normal perineal tone.  Neurological:     General: No focal deficit present.     Mental Status: She is alert. Mental status is at baseline.    ED Results / Procedures / Treatments   Labs (all labs ordered are listed, but only abnormal results are displayed) Labs Reviewed - No data to display  EKG None  Radiology No results found.  Procedures Procedures   Medications Ordered in ED Medications  ketorolac (TORADOL) injection 30 mg (30 mg Intramuscular Given 04/12/21 1028)  oxyCODONE-acetaminophen (PERCOCET/ROXICET) 5-325 MG per tablet 1 tablet (1 tablet Oral Given 04/12/21 1028)  dexamethasone (DECADRON) tablet 6 mg (6 mg Oral Given 04/12/21 1027)    ED Course  I have reviewed the triage vital signs and the nursing notes.  Pertinent labs & imaging results that were available during my care of the patient were reviewed by me and considered in my medical decision making (see chart for details).    MDM Rules/Calculators/A&P                           Patient expresses improvement with interventions provided here in the ER.  No red flag signs noted such as new numbness weakness or saddle anesthesia or bowel or bladder dysfunction.  Advising outpatient follow-up with her pain specialist within the week and return for worsening symptoms or weakness or numbness.  Final Clinical Impression(s) / ED Diagnoses Final diagnoses:  Left-sided low back pain with left-sided sciatica, unspecified chronicity    Rx / DC Orders ED Discharge Orders          Ordered    naproxen (NAPROSYN) 500 MG tablet  2 times daily        04/12/21 1134             Cheryll Cockayne, MD 04/12/21 1134
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
## 2021-04-14 ENCOUNTER — Emergency Department (HOSPITAL_COMMUNITY)
Admission: EM | Admit: 2021-04-14 | Discharge: 2021-04-14 | Disposition: A | Discharge status: Home / Self Care | Payer: 59 | Attending: Student | Admitting: Student

## 2021-04-14 ENCOUNTER — Emergency Department (HOSPITAL_COMMUNITY): Payer: 59

## 2021-04-14 ENCOUNTER — Encounter (HOSPITAL_COMMUNITY): Payer: Self-pay | Admitting: Emergency Medicine

## 2021-04-14 DIAGNOSIS — M25562 Pain in left knee: Secondary | ICD-10-CM | POA: Insufficient documentation

## 2021-04-14 DIAGNOSIS — G8929 Other chronic pain: Secondary | ICD-10-CM | POA: Insufficient documentation

## 2021-04-14 DIAGNOSIS — M5442 Lumbago with sciatica, left side: Secondary | ICD-10-CM | POA: Insufficient documentation

## 2021-04-14 LAB — C-REACTIVE PROTEIN: CRP: 0.6 mg/dL (ref <1.0)

## 2021-04-14 LAB — SEDIMENTATION RATE: Sed Rate: 8 mm/hr (ref 0–22)

## 2021-04-14 MED ORDER — FENTANYL CITRATE PF 50 MCG/ML IJ SOSY
50.0000 ug | PREFILLED_SYRINGE | Freq: Once | INTRAMUSCULAR | Status: AC
  Administered 2021-04-14: 50 ug via INTRAVENOUS
  Filled 2021-04-14: qty 1

## 2021-04-14 MED ORDER — MORPHINE SULFATE (PF) 4 MG/ML IV SOLN
4.0000 mg | Freq: Once | INTRAVENOUS | Status: AC
  Administered 2021-04-14: 4 mg via INTRAVENOUS
  Filled 2021-04-14: qty 1

## 2021-04-14 MED ORDER — PREDNISONE 10 MG (21) PO TBPK: ORAL_TABLET | Freq: Every day | ORAL | 0 refills | Status: DC

## 2021-04-14 MED ORDER — OXYCODONE-ACETAMINOPHEN 5-325 MG PO TABS
1.0000 | ORAL_TABLET | ORAL | Status: DC | PRN
Start: 2021-04-14 — End: 2021-04-14
  Administered 2021-04-14: 1 via ORAL
  Filled 2021-04-14: qty 1

## 2021-04-14 MED ORDER — OXYCODONE-ACETAMINOPHEN 5-325 MG PO TABS: 1.0000 | ORAL_TABLET | Freq: Four times a day (QID) | ORAL | 0 refills | Status: DC | PRN

## 2021-04-14 NOTE — Discharge Instructions (Addendum)
As we discussed your MRI did not show any new acute abnormality in your spine.  I recommend that you contact the spine surgeon you were referred to and let them know that you are having an exacerbation of symptoms and should be seen more expeditiously.  Please continue to take your at home pain regimen, the meloxicam that you got from your pain doctor, you can use the Percocets that we are providing you for breakthrough pain you can use them up to every 6 hours

## 2021-04-14 NOTE — ED Provider Notes (Signed)
[facility] EMERGENCY DEPARTMENT Provider Note   CSN: [id number] Arrival date & time: 04/14/21  0932     History Chief Complaint  Patient presents with   Back Pain    Christine Ritter is a 40 y.o. female.  Patient is presenting with an acute worsening of her chronic lower back pain.  Patient was evaluated 2 days ago for similar back pain, but reports that today she woke up and the pain is excruciating.  At time of interview patient is crying in pain, even after Percocet x1.  Patient has had surgery in her lower back once before with some improvement, however had plans for additional surgery, currently has consult to a spine doctor for further evaluation.  Patient endorses shooting pain extending down to the back of her left knee, with some pins and needles, numbness extending distal to the knee.  Patient denies any fever, vomiting, cough.  Patient does endorse some urinary frequency, and diarrhea recently.  Patient denies any recent new trauma.  Patient denies inability to urinate, or urinary incontinence, denies bowel dysfunction.  Patient is taking gabapentin, Toradol for pain at home.   Back Pain     Past Medical History:  Diagnosis Date   Anemia    Anxiety    Back pain    Pregnancy     Patient Active Problem List   Diagnosis Date Noted   History of gestational diabetes 01/16/2021   Cesarean delivery delivered 12/10/2020   Anxiety 12/10/2020   History of vacuum extraction assisted delivery 12/10/2020   Family history of malignant neoplasm of breast 11/20/2020    Past Surgical History:  Procedure Laterality Date   BACK SURGERY     CESAREAN SECTION     CESAREAN SECTION N/A 12/10/2020   Procedure: CESAREAN SECTION;  Surgeon: Lazaro Arms, MD;  Location: [facility] LD ORS;  Service: Obstetrics;  Laterality: N/A;   WISDOM TOOTH EXTRACTION       OB History     Gravida  3   Para  3   Term  3   Preterm      AB      Living  3      SAB      IAB       Ectopic      Multiple  0   Live Births  3           Family History  Problem Relation Age of Onset   Diabetes Mother    Atrial fibrillation Mother    Diabetes Maternal Grandmother    Migraines Father    Obesity Brother    Atrial fibrillation Paternal Grandfather     Social History   Tobacco Use   Smoking status: Never   Smokeless tobacco: Never  Vaping Use   Vaping Use: Never used  Substance Use Topics   Alcohol use: Not Currently   Drug use: Not Currently    Home Medications Prior to Admission medications   Medication Sig Start Date End Date Taking? Authorizing Provider  oxyCODONE-acetaminophen (PERCOCET/ROXICET) 5-325 MG tablet Take 1 tablet by mouth every 6 (six) hours as needed for severe pain. 04/14/21  Yes Pauline Pegues H, PA-C  predniSONE (STERAPRED UNI-PAK 21 TAB) 10 MG (21) TBPK tablet Take by mouth daily. Take 6 tabs by mouth daily  for 2 days, then 5 tabs for 2 days, then 4 tabs for 2 days, then 3 tabs for 2 days, 2 tabs for 2 days, then 1 tab  by mouth daily for 2 days 04/14/21  Yes Jonessa Triplett H, PA-C  naproxen (NAPROSYN) 500 MG tablet Take 1 tablet (500 mg total) by mouth 2 (two) times daily for 7 days. 04/12/21 04/19/21  Cheryll Cockayne, MD  Prenatal Vit-Fe Fumarate-FA (PNV PRENATAL PLUS MULTIVITAMIN) 27-1 MG TABS Take 1 tablet by mouth daily.    [provider]  valACYclovir (VALTREX) 500 MG tablet TAKE 1 TABLET BY MOUTH THREE TIMES A DAY 03/25/21   Lazaro Arms, MD    Allergies    Penicillins  Review of Systems   Review of Systems  Musculoskeletal:  Positive for back pain.   Physical Exam Updated Vital Signs BP 130/70   Pulse 64   Temp (!) 97.5 F (36.4 C) (Oral)   Resp 20   SpO2 98%   Physical Exam Vitals and nursing note reviewed.  Constitutional:      General: She is not in acute distress.    Appearance: Normal appearance.  HENT:     Head: Normocephalic and atraumatic.  Eyes:     General:        Right eye: No  discharge.        Left eye: No discharge.  Cardiovascular:     Rate and Rhythm: Normal rate and regular rhythm.  Pulmonary:     Effort: Pulmonary effort is normal. No respiratory distress.  Musculoskeletal:        General: No deformity.     Comments: Patient with extreme tenderness of midline lumbar spine around the L4-L5 region, with additional extreme tenderness to palpation of the paraspinous muscles on the left.  Skin:    General: Skin is warm and dry.  Neurological:     Mental Status: She is alert and oriented to person, place, and time.     Comments: Patellar reflex intact. Achilles reflex diminished on left.  Psychiatric:        Mood and Affect: Mood normal.        Behavior: Behavior normal.    ED Results / Procedures / Treatments   Labs (all labs ordered are listed, but only abnormal results are displayed) Labs Reviewed  SEDIMENTATION RATE  C-REACTIVE PROTEIN    EKG None  Radiology MR LUMBAR SPINE WO CONTRAST  Result Date: 04/14/2021 CLINICAL DATA:  Cord compression EXAM: MRI LUMBAR SPINE WITHOUT CONTRAST TECHNIQUE: Multiplanar, multisequence MR imaging of the lumbar spine was performed. No intravenous contrast was administered. COMPARISON:  MRI lumbar spine January 26, 2021. FINDINGS: Segmentation:  Standard.  Same numbering as on the prior MRI. Alignment:  No sagittal subluxation. Vertebrae: Vertebral body heights are maintained. No specific evidence of acute fracture, discitis/osteomyelitis, or suspicious bone lesion. Conus medullaris and cauda equina: Conus extends to the T12-L1 level. Conus appears normal. Paraspinal and other soft tissues: No evidence of acute abnormality. Disc levels: T12-L1: No significant disc protrusion, foraminal stenosis, or canal stenosis. L1-L2: No significant disc protrusion, foraminal stenosis, or canal stenosis. L2-L3: No significant disc protrusion, foraminal stenosis, or canal stenosis. L3-L4: No significant disc protrusion, foraminal stenosis,  or canal stenosis. L4-L5: Disc desiccation and height loss. Posterior disc bulge with small central disc protrusion and annular fissure, similar. Sequela of prior right hemilaminectomy and microdiscectomy. Similar mild bilateral subarticular recess stenosis without evidence of impingement. Central canal and foramina are patent. L5-S1: Disc height loss and desiccation. Similar versus slightly increased inferiorly dissecting left subarticular disc protrusion which impinges the descending left S1 nerve root in the left subarticular recess. Central canal  and foramina are patent. IMPRESSION: 1. At L5-S1, similar versus slightly increased inferiorly dissecting left subarticular disc protrusion which impinges the descending left S1 nerve roots. 2. At L4-L5, similar sequela of prior right hemilaminectomy and microdiscectomy. Similar mild bilateral subarticular recess at this level without evidence of impingement. No significant central canal or foraminal stenosis. Electronically Signed   By: Feliberto Harts M.D.   On: 04/14/2021 11:34    Procedures Procedures   Medications Ordered in ED Medications  oxyCODONE-acetaminophen (PERCOCET/ROXICET) 5-325 MG per tablet 1 tablet (1 tablet Oral Given 04/14/21 0804)  fentaNYL (SUBLIMAZE) injection 50 mcg (50 mcg Intravenous Given 04/14/21 0909)  morphine 4 MG/ML injection 4 mg (4 mg Intravenous Given 04/14/21 0955)    ED Course  I have reviewed the triage vital signs and the nursing notes.  Pertinent labs & imaging results that were available during my care of the patient were reviewed by me and considered in my medical decision making (see chart for details).    MDM Rules/Calculators/A&P                          I discussed this case with my attending physician who cosigned this note including patient's presenting symptoms, physical exam, and planned diagnostics and interventions. Attending physician stated agreement with plan or made changes to plan which were  implemented.   Exacerbation of acute on chronic lower back pain.  MRI does not reveal any new significant changes.  No listhesis, no slipped disc, some worsening of prior disc impingement herniation at L1.  We will provide steroid Dosepak to help with inflammation at this time, as well as a few Percocet for breakthrough pain.  Recommend the patient use existing prescriptions for gabapentin, and meloxicam, as well as OTC Tylenol prior to using Percocet.  Extensive return precautions discussed including new or worsening pain, fever, saddle anesthesia, urinary or bowel difficulty. No red flag symptoms of back pain seen today including prior IV drug use, chronic corticosteroid use, saddle anesthesia or fever.   Final Clinical Impression(s) / ED Diagnoses Final diagnoses:  Acute left-sided low back pain with left-sided sciatica    Rx / DC Orders ED Discharge Orders          Ordered    predniSONE (STERAPRED UNI-PAK 21 TAB) 10 MG (21) TBPK tablet  Daily        04/14/21 1208    oxyCODONE-acetaminophen (PERCOCET/ROXICET) 5-325 MG tablet  Every 6 hours PRN        04/14/21 1208             Olene Floss, PA-C 04/14/21 1213    Kommor, Wyn Forster, MD 04/14/21 1555

## 2021-04-14 NOTE — ED Triage Notes (Signed)
Patient with history of chronic back pain complains of sudden increase in back back starting this morning at 0100. Patient reports similar issue earlier in the week, states she has contacted her pain clinic requesting referral to spinal surgeon. Denies incontinence.

## 2021-04-14 NOTE — ED Notes (Signed)
Pt to MRI

## 2021-04-17 DIAGNOSIS — M5126 Other intervertebral disc displacement, lumbar region: Secondary | ICD-10-CM | POA: Insufficient documentation

## 2021-07-21 ENCOUNTER — Other Ambulatory Visit: Payer: Self-pay

## 2021-07-21 ENCOUNTER — Other Ambulatory Visit: Payer: 59

## 2021-07-21 DIAGNOSIS — Z8632 Personal history of gestational diabetes: Secondary | ICD-10-CM

## 2021-07-22 ENCOUNTER — Other Ambulatory Visit: Payer: Self-pay | Admitting: Women's Health

## 2021-07-22 DIAGNOSIS — R7303 Prediabetes: Secondary | ICD-10-CM

## 2021-07-22 LAB — HEMOGLOBIN A1C
Est. average glucose Bld gHb Est-mCnc: 120 mg/dL
Hgb A1c MFr Bld: 5.8 % — ABNORMAL HIGH (ref 4.8–5.6)

## 2021-09-22 ENCOUNTER — Encounter: Payer: Self-pay | Admitting: Podiatry

## 2021-09-22 ENCOUNTER — Other Ambulatory Visit: Payer: Self-pay

## 2021-09-22 ENCOUNTER — Ambulatory Visit (INDEPENDENT_AMBULATORY_CARE_PROVIDER_SITE_OTHER): Payer: 59

## 2021-09-22 ENCOUNTER — Ambulatory Visit (INDEPENDENT_AMBULATORY_CARE_PROVIDER_SITE_OTHER): Payer: 59 | Admitting: Podiatry

## 2021-09-22 DIAGNOSIS — M778 Other enthesopathies, not elsewhere classified: Secondary | ICD-10-CM

## 2021-09-22 DIAGNOSIS — M8430XA Stress fracture, unspecified site, initial encounter for fracture: Secondary | ICD-10-CM | POA: Diagnosis not present

## 2021-09-22 NOTE — Progress Notes (Signed)
°  Subjective:  Patient ID: Christine Ritter, female    DOB: [birth year],  MRN: [id number]  Chief Complaint  Patient presents with   Foot Pain    The right foot is hurting on the side and has been going on for about 2 months     41 y.o. female presents with the above complaint. History confirmed with patient. States this is worst while at work. Feels a sharp pain on the outside of the foot. Has tried different shoegear for this without relief. Objective:  Physical Exam: warm, good capillary refill, no trophic changes or ulcerative lesions, normal DP and PT pulses, and normal sensory exam.  Right Foot: POP mid-shaft and distal 5th metatarsal, no pain at the base. No pain along the peroneals. No heel pain.    No images are attached to the encounter.  Radiographs: X-ray of the right foot: increased thickening of the metatarsal shafts but no acute fractures noted. Assessment:   1. Capsulitis of right foot   2. Stress reaction of bone    Plan:  Patient was evaluated and treated and all questions answered.  Stress reaction, lateral column overload -Educated on etiology -XR reviewed with patient -Discussed good supportive shoe gear. -Dispense trilock brace to offload lateral column -F/u in 3 weeks for recheck  No follow-ups on file.

## 2021-10-13 ENCOUNTER — Ambulatory Visit: Payer: 59 | Admitting: Podiatry

## 2021-10-20 ENCOUNTER — Ambulatory Visit (INDEPENDENT_AMBULATORY_CARE_PROVIDER_SITE_OTHER): Payer: 59 | Admitting: Podiatry

## 2021-10-20 ENCOUNTER — Other Ambulatory Visit: Payer: Self-pay

## 2021-10-20 DIAGNOSIS — M778 Other enthesopathies, not elsewhere classified: Secondary | ICD-10-CM

## 2021-10-20 DIAGNOSIS — M21621 Bunionette of right foot: Secondary | ICD-10-CM

## 2021-10-20 MED ORDER — MELOXICAM 15 MG PO TABS: 15.0000 mg | ORAL_TABLET | Freq: Every day | ORAL | 0 refills | Status: DC | PRN

## 2021-10-20 NOTE — Patient Instructions (Signed)
If not using the meloxiciam/mobic then you can use Voltaren gel on the area.  ? ? ?

## 2021-10-23 NOTE — Progress Notes (Signed)
Subjective: ?41 year old female presents the office today for follow-up evaluation of pain to the right foot.  Patient points to the lateral aspect of the right foot along the fifth metatarsal head where she has majority discomfort.  She is been using ankle brace that was prescribed after appointment with Dr. Samuella Cota however she states has not been helpful.  She states that she works over the weekend that is when her symptoms are worse.  No recent injuries or changes otherwise since she was last seen.  No other concerns. ? ?Objective: ?AAO x3, NAD ?DP/PT pulses palpable bilaterally, CRT less than 3 seconds ?Majority tenderness is localized to the lateral aspect of the right foot in the area of the tailor's bunion.  Minimal edema. There is mild erythema from irritation but there is no skin breakdown or warmth.  No other areas of discomfort noted today. ?No pain with calf compression, swelling, warmth, erythema ? ?Assessment: ?Capsulitis right foot ? ?Plan: ?-All treatment options discussed with the patient including all alternatives, risks, complications.  ?-The majority tenderness is localized along the area of the tailor's bunion.  Symptoms are worse when she is working.  I dispensed offloading pads for her to wear with shoes when she is working to see if this is helpful.  She can also use topical anti-inflammatory such as Voltaren gel as needed. ?-Patient encouraged to call the office with any questions, concerns, change in symptoms.  ? ?Vivi Barrack DPM ? ?

## 2021-11-17 ENCOUNTER — Other Ambulatory Visit: Payer: Self-pay | Admitting: Podiatry

## 2021-11-26 ENCOUNTER — Other Ambulatory Visit: Payer: Self-pay | Admitting: Obstetrics & Gynecology

## 2021-12-08 ENCOUNTER — Ambulatory Visit: Payer: 59 | Admitting: Podiatry

## 2021-12-15 ENCOUNTER — Other Ambulatory Visit: Payer: Self-pay | Admitting: Podiatry

## 2022-01-11 ENCOUNTER — Other Ambulatory Visit: Payer: Self-pay | Admitting: Podiatry

## 2022-02-06 IMAGING — MR MR LUMBAR SPINE W/O CM
4 of 6 series · 18 of 48 positions shown · non-contrast
Comparison: MRI lumbar spine January 26, 2021.

CLINICAL DATA: Cord compression

EXAM:
MRI LUMBAR SPINE WITHOUT CONTRAST
TECHNIQUE: Multiplanar, multisequence MR imaging of the lumbar spine was
performed. No intravenous contrast was administered.

[Series 3: T2 · sagittal · 4.0mm · 0.55mm/px · 6 of 13 slices shown (1 of 2)]
[im 1/13]
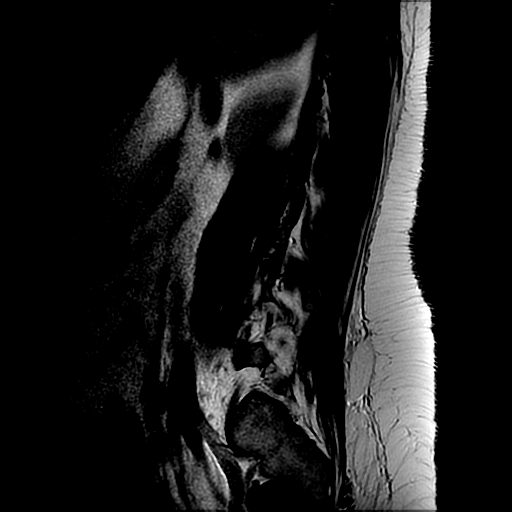
[im 3/13]
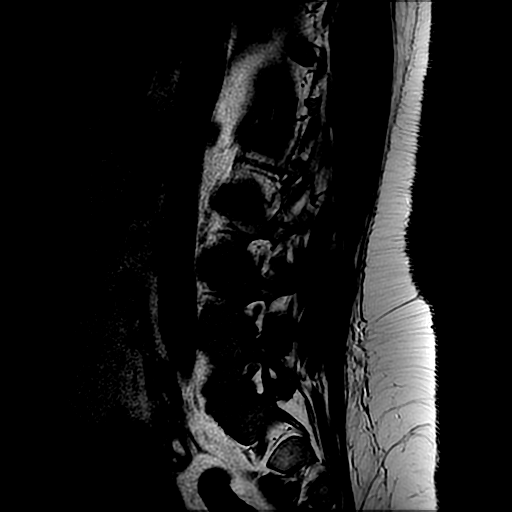
[im 5/13]
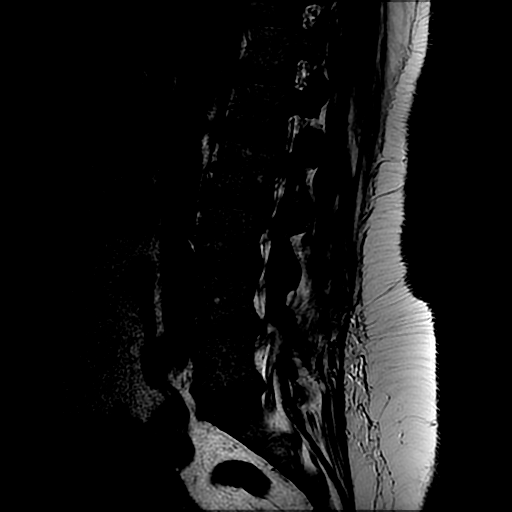
[im 8/13]
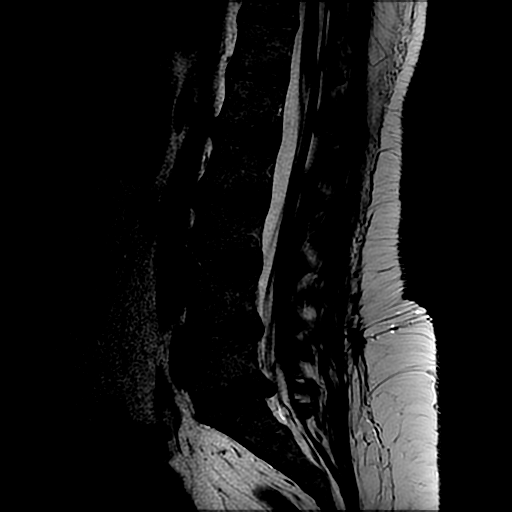
[im 10/13]
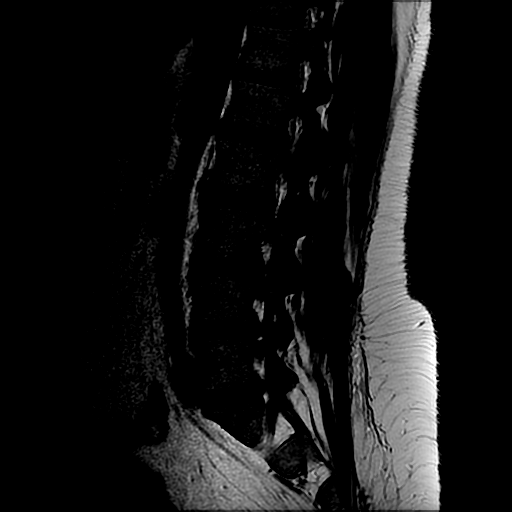
[im 13/13]
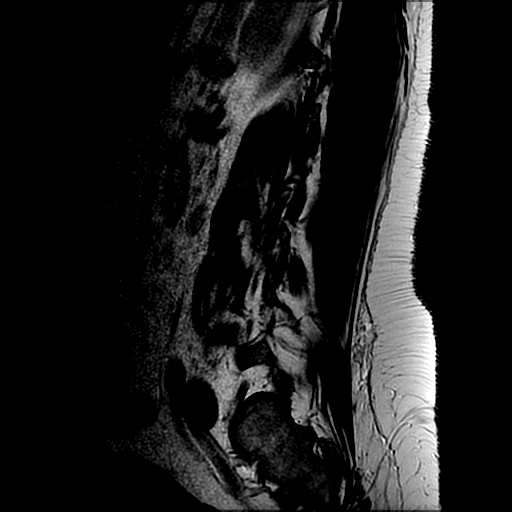

[Series 4: T1 · sagittal · 4.0mm · 0.55mm/px · 3 of 13 slices shown (1 of 2)]
[im 3/13]
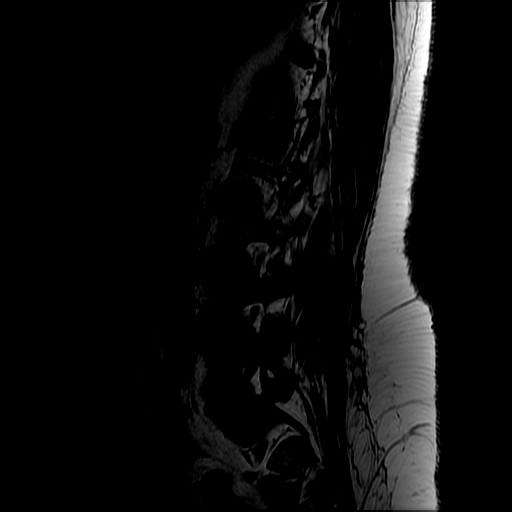
[im 8/13]
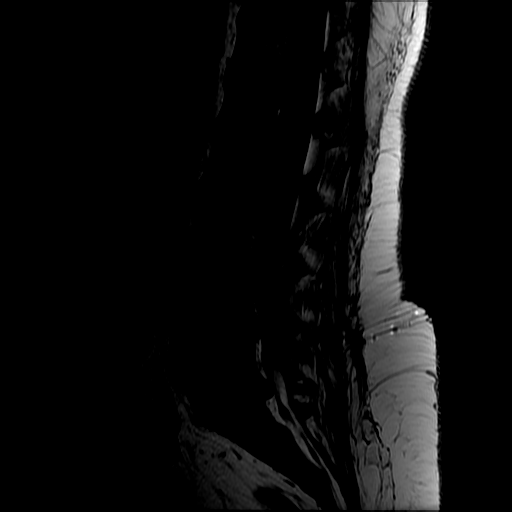
[im 13/13]
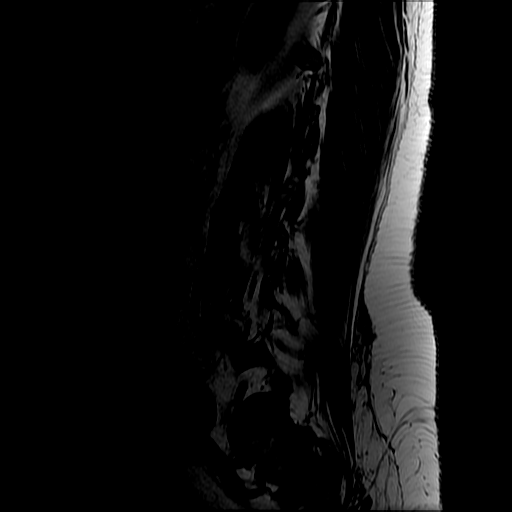

[Series 6: T2 · axial · 4.0mm · 0.39mm/px · z∈[-96,+52]mm · 6 of 32 slices shown (2 of 2)]
[im 1/32]
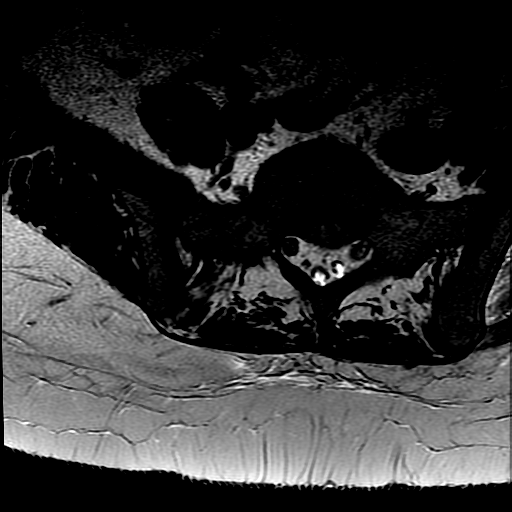
[im 6/32]
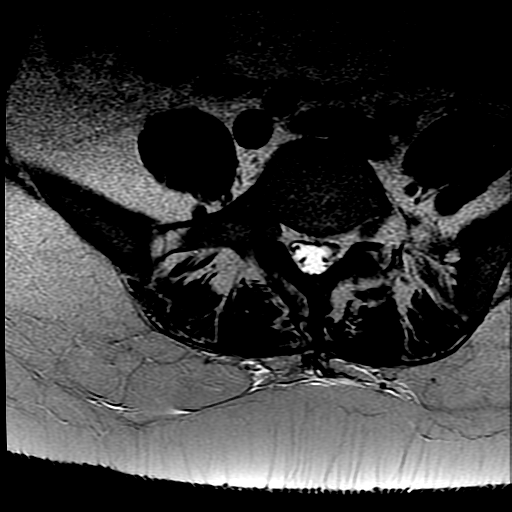
[im 11/32]
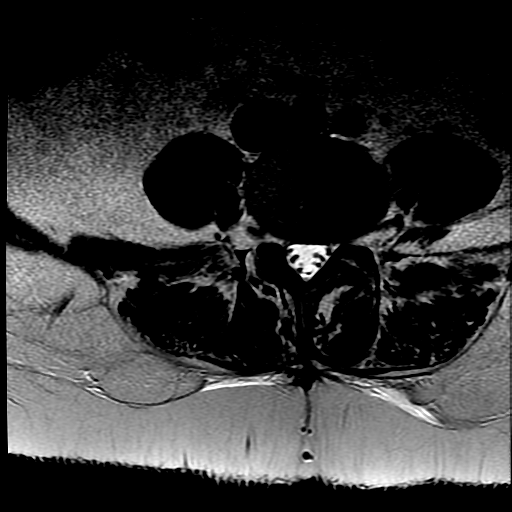
[im 13/32]
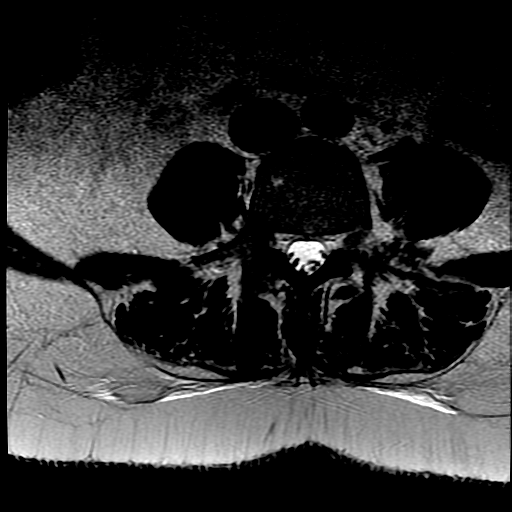
[im 16/32]
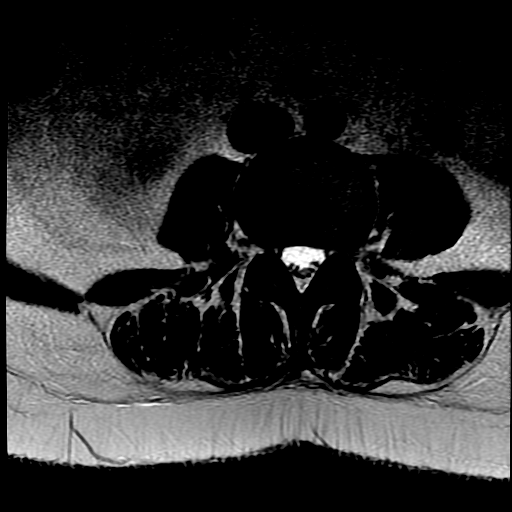
[im 26/32]
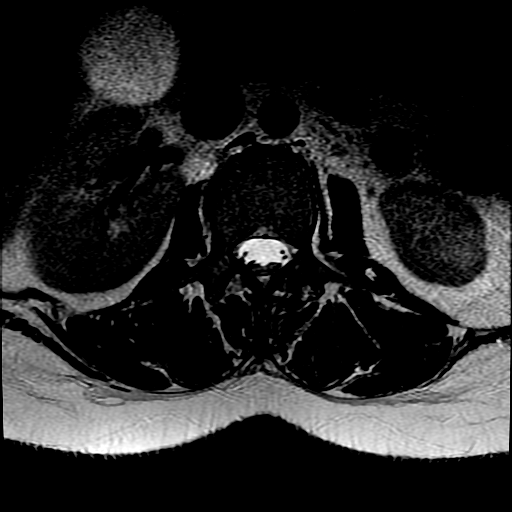

[Series 7: T1 · axial · 4.0mm · 0.39mm/px · z∈[-91,+82]mm · 3 of 11 slices shown (2 of 2)]
[im 1/11]
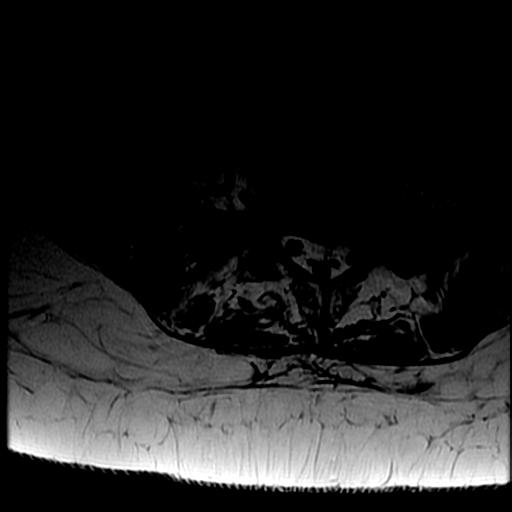
[im 6/11]
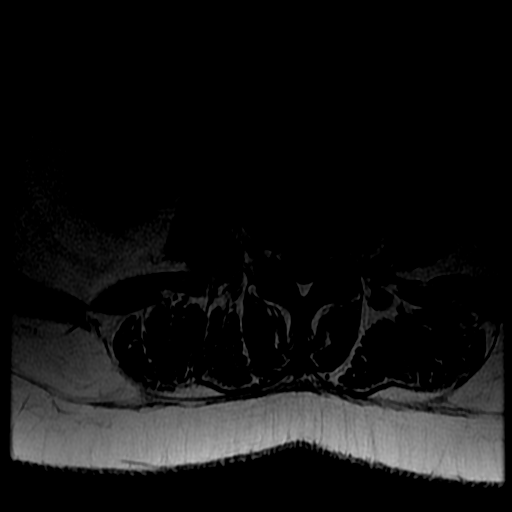
[im 11/11]
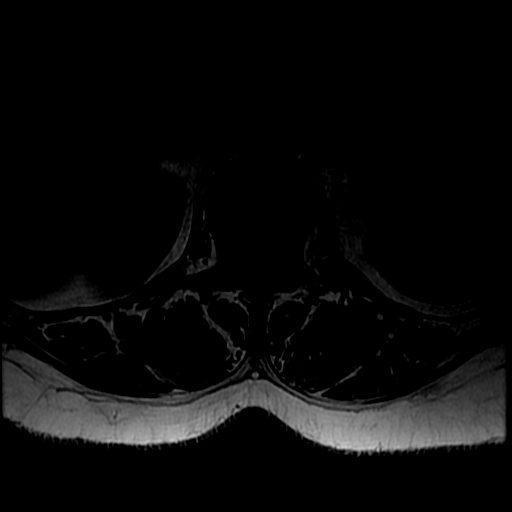

[18 of 48 positions shown; findings below may reference images not displayed]

FINDINGS: Segmentation:  Standard.  Same numbering as on the prior MRI.

Alignment:  No sagittal subluxation.

Vertebrae: Vertebral body heights are maintained. No specific
evidence of acute fracture, discitis/osteomyelitis, or suspicious
bone lesion.

Conus medullaris and cauda equina: Conus extends to the T12-L1
level. Conus appears normal.

Paraspinal and other soft tissues: No evidence of acute abnormality.

Disc levels:

T12-L1: No significant disc protrusion, foraminal stenosis, or canal
stenosis.

L1-L2: No significant disc protrusion, foraminal stenosis, or canal
stenosis.

L2-L3: No significant disc protrusion, foraminal stenosis, or canal
stenosis.

L3-L4: No significant disc protrusion, foraminal stenosis, or canal
stenosis.

L4-L5: Disc desiccation and height loss. Posterior disc bulge with
small central disc protrusion and annular fissure, similar. Sequela
of prior right hemilaminectomy and microdiscectomy. Similar mild
bilateral subarticular recess stenosis without evidence of
impingement. Central canal and foramina are patent.

L5-S1: Disc height loss and desiccation. Similar versus slightly
increased inferiorly dissecting left subarticular disc protrusion
which impinges the descending left S1 nerve root in the left
subarticular recess. Central canal and foramina are patent.
IMPRESSION: 1. At L5-S1, similar versus slightly increased inferiorly dissecting
left subarticular disc protrusion which impinges the descending left
S1 nerve roots.
2. At L4-L5, similar sequela of prior right hemilaminectomy and
microdiscectomy. Similar mild bilateral subarticular recess at this
level without evidence of impingement. No significant central canal
or foraminal stenosis.

## 2022-02-08 ENCOUNTER — Other Ambulatory Visit: Payer: Self-pay | Admitting: Podiatry

## 2022-05-11 ENCOUNTER — Ambulatory Visit (INDEPENDENT_AMBULATORY_CARE_PROVIDER_SITE_OTHER): Payer: 59 | Admitting: Women's Health

## 2022-05-11 ENCOUNTER — Other Ambulatory Visit (HOSPITAL_COMMUNITY)
Admission: RE | Admit: 2022-05-11 | Discharge: 2022-05-11 | Disposition: A | Discharge status: Home / Self Care | Payer: 59 | Source: Ambulatory Visit | Attending: Women's Health | Admitting: Women's Health

## 2022-05-11 ENCOUNTER — Encounter: Payer: Self-pay | Admitting: Women's Health

## 2022-05-11 VITALS — BP 122/77 | Ht 69.0 in | Wt 264.2 lb

## 2022-05-11 DIAGNOSIS — Z01419 Encounter for gynecological examination (general) (routine) without abnormal findings: Secondary | ICD-10-CM | POA: Diagnosis present

## 2022-05-11 DIAGNOSIS — Z1329 Encounter for screening for other suspected endocrine disorder: Secondary | ICD-10-CM | POA: Diagnosis not present

## 2022-05-11 DIAGNOSIS — Z1321 Encounter for screening for nutritional disorder: Secondary | ICD-10-CM | POA: Diagnosis not present

## 2022-05-11 DIAGNOSIS — Z8632 Personal history of gestational diabetes: Secondary | ICD-10-CM

## 2022-05-11 DIAGNOSIS — Z803 Family history of malignant neoplasm of breast: Secondary | ICD-10-CM

## 2022-05-11 DIAGNOSIS — Z1231 Encounter for screening mammogram for malignant neoplasm of breast: Secondary | ICD-10-CM

## 2022-05-11 DIAGNOSIS — Z13220 Encounter for screening for lipoid disorders: Secondary | ICD-10-CM

## 2022-05-11 DIAGNOSIS — N921 Excessive and frequent menstruation with irregular cycle: Secondary | ICD-10-CM

## 2022-05-11 DIAGNOSIS — Z131 Encounter for screening for diabetes mellitus: Secondary | ICD-10-CM

## 2022-05-11 MED ORDER — LO LOESTRIN FE 1 MG-10 MCG / 10 MCG PO TABS: 1.0000 | ORAL_TABLET | Freq: Every day | ORAL | 3 refills | Status: DC

## 2022-05-11 NOTE — Progress Notes (Addendum)
WELL-WOMAN EXAMINATION Patient name: Christine Ritter MRN [id number]  Date of birth: [birth year] Chief Complaint:   Annual Exam (Wants to discuss irregular menses (heavy cycles))  History of Present Illness:   Christine Ritter is a 41 y.o. [id number] Caucasian female being seen today for a routine well-woman exam.  Current complaints: heavy irregular periods. Has always had this, more irregular since birth of last son May 2022. Bleeds anywhere from 9-16d out of month on 2-4 different episodes throughout month. No cramping/pain. At heaviest changes ultra tampon and super pad q 65mns, gets up 2-3x/night d/t soaking. Large clots (biggest- about baseball sized). Supraclitoral apex fissures often, feels like skin just tears easily w/ bathing, foreplay/sex, etc-feels like papercut. No problem w/ lubrication.   01/26/21 had MRI lumbar spine w/ [facility] w/ incidental finding of 5.6cm cystic lesion at posterior mid-right pelvis, partially visualized.  [facility] ovary measuring 3.0cm and 3.1cm, endometrium 2cm, no definite focal abnormality to correspond to cystic collection seen on MRI.  02/20/21 CT abd/pelvis w/ [facility], small cyst/follicle Lt ovary 21.1HE Rt ovary unremarkable. Previously seen cystic lesion posterior to uterus in Rt adnexa on prior lumbar spine MRI not visualized    [place]     does desire labs Patient's last menstrual period was 04/29/2022 (exact date). The current method of family planning is vasectomy.  Last pap 08/24/19. Results were: NILM w/ no mention of HRHPV  Last mammogram: never. Results were: N/A. Family h/o breast cancer: yes mom dx w/ pre-cancerous cells @ 268yoand had prophylactic mastectomy, MGA died 40yo+BRCA, pt neg BRCA gene Last colonoscopy: never. Results were: N/A. Family h/o colorectal cancer: no     05/11/2022    8:46 AM 11/20/2020   10:56 AM  Depression screen PHQ 2/9  Decreased Interest 0 0  Down,  Depressed, Hopeless 0 0  PHQ - 2 Score 0 0  Altered sleeping 1 1  Tired, decreased energy 1 1  Change in appetite 0 0  Feeling bad or failure about yourself  0 0  Trouble concentrating 0 0  Moving slowly or fidgety/restless 0 0  Suicidal thoughts 0 0  PHQ-9 Score 2 2        05/11/2022    8:47 AM 11/20/2020   10:56 AM  GAD 7 : Generalized Anxiety Score  Nervous, Anxious, on Edge 0 1  Control/stop worrying 0 0  Worry too much - different things 1 0  Trouble relaxing 0 0  Restless 0 0  Easily annoyed or irritable 0 1  Afraid - awful might happen 0 0  Total GAD 7 Score 1 2     Review of Systems:   Pertinent items are noted in HPI Denies any headaches, blurred vision, fatigue, shortness of breath, chest pain, abdominal pain, abnormal vaginal discharge/itching/odor/irritation, problems with periods, bowel movements, urination, or intercourse unless otherwise stated above. Pertinent History Reviewed:  Reviewed past medical,surgical, social and family history.  Reviewed problem list, medications and allergies. Physical Assessment:   Vitals:   05/11/22 0838  BP: 122/77  Weight: 264 lb 4 oz (119.9 kg)  Height: 5' 9"  (1.753 m)  Body mass index is 39.02 kg/m.        Physical Examination:   General appearance - well appearing, and in no distress  Mental status - alert, oriented to person, place, and time  Psych:  She has a normal mood and affect  Skin -  warm and dry, normal color, no suspicious lesions noted  Chest - effort normal, all lung fields clear to auscultation bilaterally  Heart - normal rate and regular rhythm  Neck:  midline trachea, no thyromegaly or nodules  Breasts - breasts appear normal, no suspicious masses, no skin or nipple changes or  axillary nodes  Abdomen - soft, nontender, nondistended, no masses or organomegaly  Pelvic - VULVA: normal appearing vulva with no masses, tenderness or lesions  VAGINA: normal appearing vagina with normal color and discharge,  no lesions  CERVIX: normal appearing cervix without discharge or lesions, no CMT  Thin prep pap is done w/ HR HPV cotesting  UTERUS: uterus is felt to be normal size, shape, consistency and nontender   ADNEXA: No adnexal masses or tenderness noted.  Extremities:  No swelling or varicosities noted  Chaperone: Andrez Grime    No results found for this or any previous visit (from the past 24 hour(s)).  Assessment & Plan:  1) Well-Woman Exam  2) Menometrorrhagia>will check gc/ct/trich on pap and see if any BV/yeast shows, will get updated  pelvic u/s, discussed options for period management- wants to try COCs. Rx LoLoestrin, f/u 30mhs  3) Recurrent supraclitoral fissures> will see if any vaginitis on pap, if not hopefully estrogen from COCs will help some.  4) Family h/o breast cancer> pt BRCA neg per her report  Labs/procedures today: pap  Mammogram: schedule screening mammo as soon as possible, order placed and number given to call and schedule Colonoscopy: @ 40yo, or sooner if problems  Orders Placed This Encounter  Procedures   MM 3D SCREEN BREAST BILATERAL   CBC   Comprehensive metabolic panel   TSH   Lipid panel   Hemoglobin A1c   VITAMIN D 25 Hydroxy (Vit-D Deficiency, Fractures)    Meds:  Meds ordered this encounter  Medications   LO LOESTRIN FE 1 MG-10 MCG / 10 MCG tablet    Sig: Take 1 tablet by mouth daily.    Dispense:  90 tablet    Refill:  3    For co-pay card, pt to text "Lo Loestrin Fe " to [phone]             Co-pay card must be run in second position  "other coverage code 3"  if denied d/t PA, step edit, or insurance denial    Order Specific Question:   Supervising Provider    Answer:   EFlorian Buff[[id number]]    Follow-up: Return for 1st available, US:GYN and f/u w/ me; then 329ms from now for med f/u in person.  [facility]0/10/2021 1:04 PM

## 2022-05-11 NOTE — Patient Instructions (Signed)
Call [phone] to schedule your mammogram 

## 2022-05-12 LAB — HEMOGLOBIN A1C
Est. average glucose Bld gHb Est-mCnc: 117 mg/dL
Hgb A1c MFr Bld: 5.7 % — ABNORMAL HIGH (ref 4.8–5.6)

## 2022-05-12 LAB — CYTOLOGY - PAP
Chlamydia: NEGATIVE
Comment: NEGATIVE
Comment: NEGATIVE
Comment: NEGATIVE
Comment: NORMAL
Diagnosis: NEGATIVE
High risk HPV: NEGATIVE
Neisseria Gonorrhea: NEGATIVE
Trichomonas: NEGATIVE

## 2022-05-12 LAB — LIPID PANEL
Chol/HDL Ratio: 3.2 ratio (ref 0.0–4.4)
Cholesterol, Total: 144 mg/dL (ref 100–199)
HDL: 45 mg/dL (ref >39)
LDL Chol Calc (NIH): 87 mg/dL (ref 0–99)
Triglycerides: 59 mg/dL (ref 0–149)
VLDL Cholesterol Cal: 12 mg/dL (ref 5–40)

## 2022-05-12 LAB — COMPREHENSIVE METABOLIC PANEL
ALT: 16 IU/L (ref 0–32)
AST: 16 IU/L (ref 0–40)
Albumin/Globulin Ratio: 1.8 (ref 1.2–2.2)
Albumin: 4.6 g/dL (ref 3.9–4.9)
Alkaline Phosphatase: 90 IU/L (ref 44–121)
BUN/Creatinine Ratio: 16 (ref 9–23)
BUN: 10 mg/dL (ref 6–24)
Bilirubin Total: 0.3 mg/dL (ref 0.0–1.2)
CO2: 23 mmol/L (ref 20–29)
Calcium: 9.3 mg/dL (ref 8.7–10.2)
Chloride: 99 mmol/L (ref 96–106)
Creatinine, Ser: 0.64 mg/dL (ref 0.57–1.00)
Globulin, Total: 2.6 g/dL (ref 1.5–4.5)
Glucose: 96 mg/dL (ref 70–99)
Potassium: 4.6 mmol/L (ref 3.5–5.2)
Sodium: 136 mmol/L (ref 134–144)
Total Protein: 7.2 g/dL (ref 6.0–8.5)
eGFR: 114 mL/min/{1.73_m2} (ref >59)

## 2022-05-12 LAB — CBC
Hematocrit: 33.4 % — ABNORMAL LOW (ref 34.0–46.6)
Hemoglobin: 9.5 g/dL — ABNORMAL LOW (ref 11.1–15.9)
MCH: 20.4 pg — ABNORMAL LOW (ref 26.6–33.0)
MCHC: 28.4 g/dL — ABNORMAL LOW (ref 31.5–35.7)
MCV: 72 fL — ABNORMAL LOW (ref 79–97)
Platelets: 259 10*3/uL (ref 150–450)
RBC: 4.66 x10E6/uL (ref 3.77–5.28)
RDW: 18.5 % — ABNORMAL HIGH (ref 11.7–15.4)
WBC: 5.2 10*3/uL (ref 3.4–10.8)

## 2022-05-12 LAB — TSH: TSH: 1.52 u[IU]/mL (ref 0.450–4.500)

## 2022-05-12 LAB — VITAMIN D 25 HYDROXY (VIT D DEFICIENCY, FRACTURES): Vit D, 25-Hydroxy: 18.1 ng/mL — ABNORMAL LOW (ref 30.0–100.0)

## 2022-05-12 MED ORDER — FERROUS SULFATE 325 (65 FE) MG PO TABS: 325.0000 mg | ORAL_TABLET | ORAL | 3 refills | Status: AC

## 2022-05-12 NOTE — Addendum Note (Signed)
Addended by: Roma Schanz on: 05/12/2022 06:51 AM   Modules accepted: Orders

## 2022-05-21 ENCOUNTER — Encounter: Payer: Self-pay | Admitting: Women's Health

## 2022-05-24 ENCOUNTER — Other Ambulatory Visit: Payer: Self-pay | Admitting: Women's Health

## 2022-05-24 MED ORDER — NYSTATIN 100000 UNIT/GM EX POWD: 1.0000 | Freq: Three times a day (TID) | CUTANEOUS | 0 refills | Status: AC

## 2022-05-26 ENCOUNTER — Ambulatory Visit (HOSPITAL_COMMUNITY): Payer: 59

## 2022-05-26 ENCOUNTER — Ambulatory Visit (HOSPITAL_COMMUNITY)
Admission: RE | Admit: 2022-05-26 | Discharge: 2022-05-26 | Disposition: A | Discharge status: Home / Self Care | Payer: 59 | Source: Ambulatory Visit | Attending: Women's Health | Admitting: Women's Health

## 2022-05-26 DIAGNOSIS — Z1231 Encounter for screening mammogram for malignant neoplasm of breast: Secondary | ICD-10-CM | POA: Diagnosis present

## 2022-05-26 DIAGNOSIS — Z803 Family history of malignant neoplasm of breast: Secondary | ICD-10-CM | POA: Diagnosis present

## 2022-06-07 ENCOUNTER — Other Ambulatory Visit: Payer: 59

## 2022-06-11 ENCOUNTER — Other Ambulatory Visit: Payer: Self-pay | Admitting: Women's Health

## 2022-06-11 DIAGNOSIS — N921 Excessive and frequent menstruation with irregular cycle: Secondary | ICD-10-CM

## 2022-06-13 ENCOUNTER — Encounter: Payer: Self-pay | Admitting: Women's Health

## 2022-06-21 ENCOUNTER — Ambulatory Visit (INDEPENDENT_AMBULATORY_CARE_PROVIDER_SITE_OTHER): Payer: 59 | Admitting: Women's Health

## 2022-06-21 ENCOUNTER — Encounter: Payer: Self-pay | Admitting: Women's Health

## 2022-06-21 ENCOUNTER — Ambulatory Visit (INDEPENDENT_AMBULATORY_CARE_PROVIDER_SITE_OTHER): Payer: 59

## 2022-06-21 VITALS — BP 128/77 | HR 65 | Ht 69.0 in | Wt 267.5 lb

## 2022-06-21 DIAGNOSIS — N921 Excessive and frequent menstruation with irregular cycle: Secondary | ICD-10-CM

## 2022-06-21 NOTE — Progress Notes (Signed)
GYN VISIT Patient name: Christine Ritter MRN [id number]  Date of birth: 1981-05-28 Chief Complaint:   discuss [place] results  History of Present Illness:   Christine Ritter is a 41 y.o. G41P3003 Caucasian female being seen today for f/u after pelvic u/s done for heavy irregular periods. Bleeds 9-16d out of month on 2-4 different occasions. No cramping/pain. At heaviest changes ultra tampon and super pad q , gets up 2-3x/night d/t soaking. Large clots (biggest-about baseball sized).  Started LoLoestrin at [employer], states had worst cycle last month.  Bled 4th-7th, 10th-11th, 17th-1st. Was very light and only needed a pad for 1st 6d then 7d of heavy flow, then light for last 3 days.  A little crampy.  Patient's last menstrual period was 05/25/2022. The current method of family planning is OCP (estrogen/progesterone) and vasectomy.  Last pap 08/24/19. Results were: NILM w/ no mention of HPV     05/11/2022    8:46 AM 11/20/2020   10:56 AM  Depression screen PHQ 2/9  Decreased Interest 0 0  Down, Depressed, Hopeless 0 0  PHQ - 2 Score 0 0  Altered sleeping 1 1  Tired, decreased energy 1 1  Change in appetite 0 0  Feeling bad or failure about yourself  0 0  Trouble concentrating 0 0  Moving slowly or fidgety/restless 0 0  Suicidal thoughts 0 0  PHQ-9 Score 2 2        05/11/2022    8:47 AM 11/20/2020   10:56 AM  GAD 7 : Generalized Anxiety Score  Nervous, Anxious, on Edge 0 1  Control/stop worrying 0 0  Worry too much - different things 1 0  Trouble relaxing 0 0  Restless 0 0  Easily annoyed or irritable 0 1  Afraid - awful might happen 0 0  Total GAD 7 Score 1 2     Review of Systems:   Pertinent items are noted in HPI Denies fever/chills, dizziness, headaches, visual disturbances, fatigue, shortness of breath, chest pain, abdominal pain, vomiting, abnormal vaginal discharge/itching/odor/irritation, problems with periods, bowel movements, urination, or intercourse unless otherwise  stated above.  Pertinent History Reviewed:  Reviewed past medical,surgical, social, obstetrical and family history.  Reviewed problem list, medications and allergies. Physical Assessment:   Vitals:   06/21/22 1130  BP: 128/77  Pulse: 65  Weight: 267 lb 8 oz (121.3 kg)  Height: 5\' 9"  (1.753 m)  Body mass index is 39.5 kg/m.       Physical Examination:   General appearance: alert, well appearing, and in no distress  Mental status: alert, oriented to person, place, and time  Skin: warm & dry   Cardiovascular: normal heart rate noted  Respiratory: normal respiratory effort, no distress  Abdomen: soft, non-tender   Pelvic: examination not indicated  Extremities: no edema   Chaperone: N/A    TODAY'S U/S: TA/TV: enlarged heterogeneous anteverted uterus,multiple small simple nabothian cysts,EEC 17.6 mm,normal left ovary,simple right ovarian cyst 3.1 x 2.7 x 2.7 cm,unable to slide ovaries,no free fluid,no pain during ultrasound   No results found for this or any previous visit (from the past 24 hour(s)).  Assessment & Plan:  1) Menometrorrhagia w/ enlarged uterus and thickened endometrium> currently on LoLo x [place], will f/u in to see how it's going. If not improving, can switch to progestin-only method to see if will help thin endometrium and shorten/lighten/stop periods.   Meds: No orders of the defined types were placed in this encounter.  No orders of the defined types were placed in this encounter.   Return for after 1/3 med f/u w/ me in person.  Martin, [facility] 06/21/2022 1:58 PM

## 2022-06-22 ENCOUNTER — Other Ambulatory Visit: Payer: 59

## 2022-08-16 ENCOUNTER — Encounter: Payer: Self-pay | Admitting: Women's Health

## 2022-08-16 ENCOUNTER — Ambulatory Visit (INDEPENDENT_AMBULATORY_CARE_PROVIDER_SITE_OTHER): Payer: 59 | Admitting: Women's Health

## 2022-08-16 VITALS — BP 127/87 | HR 73 | Ht 69.0 in | Wt 270.0 lb

## 2022-08-16 DIAGNOSIS — R9389 Abnormal findings on diagnostic imaging of other specified body structures: Secondary | ICD-10-CM | POA: Diagnosis not present

## 2022-08-16 DIAGNOSIS — N920 Excessive and frequent menstruation with regular cycle: Secondary | ICD-10-CM

## 2022-08-16 NOTE — Progress Notes (Signed)
GYN VISIT Patient name: Christine Ritter MRN [id number]  Date of birth: [birth year] Chief Complaint:   Follow-up (Bleeding amount less, time bleeding the same)  History of Present Illness:   Christine Ritter is a 42 y.o. G13P3003 Caucasian female being seen today for f/u LoLoestrin rx'd 05/11/22 for menometrorrhagia. Was bleeding 9-16d out of month on 2-4 different occasions. At heaviest was changing ultra tampon and super pad q62min and getting up 2-3x/night d/t soaking. Large clots. No pain. Had pelvic u/s 11/13: enlarged heterogeneous anteverted uterus,multiple small simple nabothian cysts,EEC 17.6 mm,normal left ovary,simple right ovarian cyst 3.1 x 2.7 x 2.7 cm,unable to slide ovaries,no free fluid,no pain during ultrasound.  Today reports bleeding 13d out of month, but all at one time. 1st week is spotting, heavy x 2d (still changing ultra tampon and super pad q33min, large lemon to orange sized clots), no pain. Possibly interested in surgical intervention. Discussed potential need for endometrial biopsy.   Patient's last menstrual period was 07/25/2022. The current method of family planning is OCP (estrogen/progesterone) and vasectomy.  Last pap 05/11/22. Results were: NILM w/ HRHPV negative     05/11/2022    8:46 AM 11/20/2020   10:56 AM  Depression screen PHQ 2/9  Decreased Interest 0 0  Down, Depressed, Hopeless 0 0  PHQ - 2 Score 0 0  Altered sleeping 1 1  Tired, decreased energy 1 1  Change in appetite 0 0  Feeling bad or failure about yourself  0 0  Trouble concentrating 0 0  Moving slowly or fidgety/restless 0 0  Suicidal thoughts 0 0  PHQ-9 Score 2 2        05/11/2022    8:47 AM 11/20/2020   10:56 AM  GAD 7 : Generalized Anxiety Score  Nervous, Anxious, on Edge 0 1  Control/stop worrying 0 0  Worry too much - different things 1 0  Trouble relaxing 0 0  Restless 0 0  Easily annoyed or irritable 0 1  Afraid - awful might happen 0 0  Total GAD 7 Score 1 2     Review of  Systems:   Pertinent items are noted in HPI Denies fever/chills, dizziness, headaches, visual disturbances, fatigue, shortness of breath, chest pain, abdominal pain, vomiting, abnormal vaginal discharge/itching/odor/irritation, problems with periods, bowel movements, urination, or intercourse unless otherwise stated above.  Pertinent History Reviewed:  Reviewed past medical,surgical, social, obstetrical and family history.  Reviewed problem list, medications and allergies. Physical Assessment:   Vitals:   08/16/22 0838  BP: 127/87  Pulse: 73  Weight: 270 lb (122.5 kg)  Height: 5\' 9"  (1.753 m)  Body mass index is 39.87 kg/m.       Physical Examination:   General appearance: alert, well appearing, and in no distress  Mental status: alert, oriented to person, place, and time  Skin: warm & dry   Cardiovascular: normal heart rate noted  Respiratory: normal respiratory effort, no distress  Abdomen: soft, non-tender   Pelvic: examination not indicated  Extremities: no edema   Chaperone: N/A    No results found for this or any previous visit (from the past 24 hour(s)).  Assessment & Plan:  1) Menorrhagia w/ prolonged cycle, thickened endometrium> slightly better on COCs, but still not optimal. Discussed other options- medicinal vs surgical and potential need for endometrial biopsy. Wants to see Dr. Elonda Husky, will schedule for biopsy, and to discuss options. Continue COCs until next visit.   Meds: No orders of the defined types were  placed in this encounter.   No orders of the defined types were placed in this encounter.   Return for 1st available Dr. Despina Hidden endometrial biopsy.  Cheral Marker CNM, [facility] 08/16/2022 9:15 AM

## 2022-08-23 ENCOUNTER — Encounter: Payer: Self-pay | Admitting: Obstetrics & Gynecology

## 2022-08-26 ENCOUNTER — Encounter: Payer: Self-pay | Admitting: Obstetrics & Gynecology

## 2022-08-26 ENCOUNTER — Ambulatory Visit (INDEPENDENT_AMBULATORY_CARE_PROVIDER_SITE_OTHER): Payer: 59 | Admitting: Obstetrics & Gynecology

## 2022-08-26 VITALS — BP 132/81 | HR 72 | Ht 69.0 in

## 2022-08-26 DIAGNOSIS — N921 Excessive and frequent menstruation with irregular cycle: Secondary | ICD-10-CM | POA: Diagnosis not present

## 2022-08-26 MED ORDER — MEGESTROL ACETATE 40 MG PO TABS: 40.0000 mg | ORAL_TABLET | Freq: Every day | ORAL | 4 refills | Status: DC

## 2022-08-26 NOTE — Progress Notes (Signed)
Follow up appointment for results: Response to cycle management  Chief Complaint  Patient presents with   Menorrhagia    Discuss endo bx and hysterectomy     Blood pressure 132/81, pulse 72, height 5\' 9"  (1.753 m), last menstrual period 08/24/2022, not currently breastfeeding.  Doing better on the Loloestrin still with 13 days total of bleeding but volume overall is dramatically decreased No cramps    MEDS ordered this encounter: Meds ordered this encounter  Medications   megestrol (MEGACE) 40 MG tablet    Sig: Take 1 tablet (40 mg total) by mouth daily. 24 days on then 4 days off. Repeat.    Dispense:  24 tablet    Refill:  4    Orders for this encounter: No orders of the defined types were placed in this encounter.   Impression + Management Plan   ICD-10-CM   1. Menometrorrhagia  N92.1    trial megestrol cycling 24 days on, 4 days off-->repeat      Follow Up: Return in about 4 months (around 12/25/2022) for Follow up, with Dr Elonda Husky.     All questions were answered.  Past Medical History:  Diagnosis Date   Anemia    Anxiety    Back pain    Pregnancy     Past Surgical History:  Procedure Laterality Date   BACK SURGERY     CESAREAN SECTION     CESAREAN SECTION N/A 12/10/2020   Procedure: CESAREAN SECTION;  Surgeon: Florian Buff, MD;  Location: [facility] LD ORS;  Service: Obstetrics;  Laterality: N/A;   WISDOM TOOTH EXTRACTION      OB History     Gravida  3   Para  3   Term  3   Preterm      AB      Living  3      SAB      IAB      Ectopic      Multiple  0   Live Births  3           Allergies  Allergen Reactions   Penicillins Anaphylaxis and Rash    Reaction: Childhood    Social History   Socioeconomic History   Marital status: Significant Other    Spouse name: Not on file   Number of children: Not on file   Years of education: Not on file   Highest education level: Not on file  Occupational History   Not on file  Tobacco  Use   Smoking status: Never   Smokeless tobacco: Never  Vaping Use   Vaping Use: Never used  Substance and Sexual Activity   Alcohol use: Yes    Comment: once a month   Drug use: Not Currently   Sexual activity: Yes    Birth control/protection: Other-see comments    Comment: vasectomy (finance)  Other Topics Concern   Not on file  Social History Narrative   Not on file   Social Determinants of Health   Financial Resource Strain: Low Risk  (05/11/2022)   Overall Financial Resource Strain (CARDIA)    Difficulty of Paying Living Expenses: Not very hard  Food Insecurity: No Food Insecurity (05/11/2022)   Hunger Vital Sign    Worried About Running Out of Food in the Last Year: Never true    Ran Out of Food in the Last Year: Never true  Transportation Needs: No Transportation Needs (05/11/2022)   PRAPARE - Transportation    Lack of  Transportation (Medical): No    Lack of Transportation (Non-Medical): No  Physical Activity: Insufficiently Active (05/11/2022)   Exercise Vital Sign    Days of Exercise per Week: 2 days    Minutes of Exercise per Session: 20 min  Stress: No Stress Concern Present (05/11/2022)   [place]    Feeling of Stress : Only a little  Social Connections: Moderately Isolated (05/11/2022)   Social Connection and Isolation Panel [NHANES]    Frequency of Communication with Friends and Family: Three times a week    Frequency of Social Gatherings with Friends and Family: Once a week    Attends Religious Services: Never    Marine scientist or Organizations: No    Attends Music therapist: Never    Marital Status: Living with partner    Family History  Problem Relation Age of Onset   Atrial fibrillation Paternal Grandfather    Diabetes Maternal Grandmother    Migraines Father    Diabetes Mother    Atrial fibrillation Mother    Obesity Brother

## 2022-10-04 ENCOUNTER — Encounter: Payer: Self-pay | Admitting: Obstetrics & Gynecology

## 2022-12-14 ENCOUNTER — Encounter: Payer: Self-pay | Admitting: Obstetrics & Gynecology

## 2022-12-14 ENCOUNTER — Ambulatory Visit (INDEPENDENT_AMBULATORY_CARE_PROVIDER_SITE_OTHER): Payer: 59 | Admitting: Obstetrics & Gynecology

## 2022-12-14 VITALS — BP 119/81 | HR 66 | Ht 69.0 in | Wt 275.0 lb

## 2022-12-14 DIAGNOSIS — N946 Dysmenorrhea, unspecified: Secondary | ICD-10-CM | POA: Diagnosis not present

## 2022-12-14 DIAGNOSIS — N921 Excessive and frequent menstruation with irregular cycle: Secondary | ICD-10-CM | POA: Diagnosis not present

## 2022-12-14 MED ORDER — MEGESTROL ACETATE 40 MG PO TABS: 40.0000 mg | ORAL_TABLET | Freq: Every day | ORAL | 4 refills | Status: DC

## 2022-12-14 NOTE — Progress Notes (Signed)
Follow up appointment for results: Cyclical megestrol  Chief Complaint  Patient presents with   Follow-up    Mostly spotting    Blood pressure 119/81, pulse 66, height 5\' 9"  (1.753 m), weight 275 lb (124.7 kg), last menstrual period 10/29/2022.  Pt has much lighter bleeding, pale red, not even enough to wear a pad a lot of times But she does not feel her self at all on the megestrol Does not want to remain on this med and has super heavy periods off the megestrol  Uterus 240 cc no fibroids are noted  MEDS ordered this encounter: Meds ordered this encounter  Medications   megestrol (MEGACE) 40 MG tablet    Sig: Take 1 tablet (40 mg total) by mouth daily. 24 days on then 4 days off. Repeat.    Dispense:  24 tablet    Refill:  4    Orders for this encounter: No orders of the defined types were placed in this encounter.   Impression + Management Plan   ICD-10-CM   1. Menometrorrhagia  N92.1     2. Dysmenorrhea  N94.6      Discussed options, declines IUD, ablation unlikely to be a decade solution for a uterus this size  Will proceed with a robotic hysterectomy with removal of tubes  Follow Up: Return in about 2 months (around 02/25/2023) for Post Op, with Dr Despina Hidden.     All questions were answered.  Past Medical History:  Diagnosis Date   Anemia    Anxiety    Back pain    Pregnancy     Past Surgical History:  Procedure Laterality Date   BACK SURGERY     CESAREAN SECTION     CESAREAN SECTION N/A 12/10/2020   Procedure: CESAREAN SECTION;  Surgeon: Lazaro Arms, MD;  Location: [facility] LD ORS;  Service: Obstetrics;  Laterality: N/A;   WISDOM TOOTH EXTRACTION      OB History     Gravida  3   Para  3   Term  3   Preterm      AB      Living  3      SAB      IAB      Ectopic      Multiple  0   Live Births  3           Allergies  Allergen Reactions   Penicillins Anaphylaxis and Rash    Reaction: Childhood    Social History    Socioeconomic History   Marital status: Significant Other    Spouse name: Not on file   Number of children: Not on file   Years of education: Not on file   Highest education level: Not on file  Occupational History   Not on file  Tobacco Use   Smoking status: Never   Smokeless tobacco: Never  Vaping Use   Vaping Use: Never used  Substance and Sexual Activity   Alcohol use: Yes    Comment: once a month   Drug use: Not Currently   Sexual activity: Yes    Birth control/protection: Other-see comments    Comment: vasectomy (finance)  Other Topics Concern   Not on file  Social History Narrative   Not on file   Social Determinants of Health   Financial Resource Strain: Low Risk  (05/11/2022)   Overall Financial Resource Strain (CARDIA)    Difficulty of Paying Living Expenses: Not very hard  Food Insecurity: No Food  Insecurity (05/11/2022)   Hunger Vital Sign    Worried About Running Out of Food in the Last Year: Never true    Ran Out of Food in the Last Year: Never true  Transportation Needs: No Transportation Needs (05/11/2022)   PRAPARE - Administrator, Civil Service (Medical): No    Lack of Transportation (Non-Medical): No  Physical Activity: Insufficiently Active (05/11/2022)   Exercise Vital Sign    Days of Exercise per Week: 2 days    Minutes of Exercise per Session: 20 min  Stress: No Stress Concern Present (05/11/2022)   [employer] of Occupational Health - Occupational Stress Questionnaire    Feeling of Stress : Only a little  Social Connections: Moderately Isolated (05/11/2022)   Social Connection and Isolation Panel [NHANES]    Frequency of Communication with Friends and Family: Three times a week    Frequency of Social Gatherings with Friends and Family: Once a week    Attends Religious Services: Never    Database administrator or Organizations: No    Attends Engineer, structural: Never    Marital Status: Living with partner     Family History  Problem Relation Age of Onset   Atrial fibrillation Paternal Grandfather    Diabetes Maternal Grandmother    Migraines Father    Diabetes Mother    Atrial fibrillation Mother    Obesity Brother

## 2022-12-16 ENCOUNTER — Encounter: Payer: Self-pay | Admitting: Obstetrics & Gynecology

## 2022-12-27 ENCOUNTER — Other Ambulatory Visit: Payer: Self-pay

## 2022-12-27 ENCOUNTER — Encounter (HOSPITAL_COMMUNITY): Payer: Self-pay

## 2022-12-27 ENCOUNTER — Emergency Department (HOSPITAL_COMMUNITY)
Admission: EM | Admit: 2022-12-27 | Discharge: 2022-12-28 | Disposition: A | Discharge status: Home / Self Care | Payer: 59 | Attending: Emergency Medicine | Admitting: Emergency Medicine

## 2022-12-27 DIAGNOSIS — H9202 Otalgia, left ear: Secondary | ICD-10-CM | POA: Diagnosis present

## 2022-12-27 DIAGNOSIS — J069 Acute upper respiratory infection, unspecified: Secondary | ICD-10-CM | POA: Diagnosis not present

## 2022-12-27 DIAGNOSIS — H66002 Acute suppurative otitis media without spontaneous rupture of ear drum, left ear: Secondary | ICD-10-CM | POA: Insufficient documentation

## 2022-12-27 NOTE — ED Triage Notes (Signed)
POV from home. Cc of left ear ache since 8pm tonight. Hard to ear out of it as well.

## 2022-12-28 DIAGNOSIS — H66002 Acute suppurative otitis media without spontaneous rupture of ear drum, left ear: Secondary | ICD-10-CM | POA: Diagnosis not present

## 2022-12-28 MED ORDER — HYDROCODONE-ACETAMINOPHEN 5-325 MG PO TABS
1.0000 | ORAL_TABLET | Freq: Once | ORAL | Status: AC
  Administered 2022-12-28: 1 via ORAL
  Filled 2022-12-28: qty 1

## 2022-12-28 MED ORDER — AZITHROMYCIN 250 MG PO TABS
500.0000 mg | ORAL_TABLET | Freq: Once | ORAL | Status: AC
  Administered 2022-12-28: 500 mg via ORAL
  Filled 2022-12-28: qty 2

## 2022-12-28 MED ORDER — AZITHROMYCIN 250 MG PO TABS: 250.0000 mg | ORAL_TABLET | Freq: Every day | ORAL | 0 refills | Status: DC

## 2022-12-28 MED ORDER — IBUPROFEN 800 MG PO TABS
800.0000 mg | ORAL_TABLET | Freq: Once | ORAL | Status: AC
  Administered 2022-12-28: 800 mg via ORAL
  Filled 2022-12-28: qty 1

## 2022-12-28 NOTE — ED Provider Notes (Signed)
[facility] DEPARTMENT AT [facility] Provider Note   CSN: [id number] Arrival date & time: 12/27/22  2222     History  Chief Complaint  Patient presents with   Christine Ritter is a 42 y.o. female.  Patient presents for evaluation of left ear pain.  She has had cough, congestion, cold symptoms for several days.  Tonight she developed left ear pain which is severe and has noticed that she is not hearing well out of the left ear.  No drainage.       Home Medications Prior to Admission medications   Medication Sig Start Date End Date Taking? Authorizing Provider  azithromycin (ZITHROMAX) 250 MG tablet Take 1 tablet (250 mg total) by mouth daily. 12/28/22  Yes Adelayde Minney, Canary Brim, MD  ascorbic acid (VITAMIN C) 500 MG tablet Take by mouth.    [provider]  Cholecalciferol 50 MCG (2000 UT) TABS Take by mouth.    [provider]  ferrous sulfate 325 (65 FE) MG tablet Take 1 tablet (325 mg total) by mouth every other day. Patient taking differently: Take 65 mg by mouth every other day. 05/12/22   Booker, Cala Bradford R, CNM  LO LOESTRIN FE 1 MG-10 MCG / 10 MCG tablet Take 1 tablet by mouth daily. Patient not taking: Reported on 12/14/2022 05/11/22   Cheral Marker, CNM  megestrol (MEGACE) 40 MG tablet Take 1 tablet (40 mg total) by mouth daily. 24 days on then 4 days off. Repeat. 12/14/22   Lazaro Arms, MD  Multiple Vitamin (MULTI-VITAMIN) tablet     [provider]  Multiple Vitamins-Minerals (ZINC PO) Take by mouth.    [provider]  nystatin (MYCOSTATIN/NYSTOP) powder Apply 1 Application topically 3 (three) times daily. 05/24/22   Cheral Marker, CNM      Allergies    Penicillins    Review of Systems   Review of Systems  Physical Exam Updated Vital Signs BP 135/84   Pulse 86   Temp 98.9 F (37.2 C) (Oral)   Resp 20   Ht 5\' 8"  (1.727 m)   Wt 124.7 kg   LMP 10/29/2022   SpO2 98%   BMI 41.81 kg/m   Physical Exam Vitals and nursing note reviewed.  Constitutional:      General: She is not in acute distress.    Appearance: She is well-developed.  HENT:     Head: Normocephalic and atraumatic.     Left Ear: Tympanic membrane is erythematous and bulging.     Mouth/Throat:     Mouth: Mucous membranes are moist.  Eyes:     General: Vision grossly intact. Gaze aligned appropriately.     Extraocular Movements: Extraocular movements intact.     Conjunctiva/sclera: Conjunctivae normal.  Cardiovascular:     Rate and Rhythm: Normal rate and regular rhythm.     Pulses: Normal pulses.     Heart sounds: Normal heart sounds, S1 normal and S2 normal. No murmur heard.    No friction rub. No gallop.  Pulmonary:     Effort: Pulmonary effort is normal. No respiratory distress.     Breath sounds: Normal breath sounds.  Abdominal:     General: Bowel sounds are normal.     Palpations: Abdomen is soft.     Tenderness: There is no abdominal tenderness. There is no guarding or rebound.     Hernia: No hernia is present.  Musculoskeletal:  General: No swelling.     Cervical back: Full passive range of motion without pain, normal range of motion and neck supple. No spinous process tenderness or muscular tenderness. Normal range of motion.     Right lower leg: No edema.     Left lower leg: No edema.  Skin:    General: Skin is warm and dry.     Capillary Refill: Capillary refill takes less than 2 seconds.     Findings: No ecchymosis, erythema, rash or wound.  Neurological:     General: No focal deficit present.     Mental Status: She is alert and oriented to person, place, and time.     GCS: GCS eye subscore is 4. GCS verbal subscore is 5. GCS motor subscore is 6.     Cranial Nerves: Cranial nerves 2-12 are intact.     Sensory: Sensation is intact.     Motor: Motor function is intact.     Coordination: Coordination is intact.  Psychiatric:        Attention and Perception: Attention normal.         Mood and Affect: Mood normal.        Speech: Speech normal.        Behavior: Behavior normal.     ED Results / Procedures / Treatments   Labs (all labs ordered are listed, but only abnormal results are displayed) Labs Reviewed - No data to display  EKG None  Radiology No results found.  Procedures Procedures    Medications Ordered in ED Medications  ibuprofen (ADVIL) tablet 800 mg (has no administration in time range)  HYDROcodone-acetaminophen (NORCO/VICODIN) 5-325 MG per tablet 1 tablet (has no administration in time range)  azithromycin (ZITHROMAX) tablet 500 mg (has no administration in time range)    ED Course/ Medical Decision Making/ A&P                             Medical Decision Making  Differential Diagnosis considered includes, but not limited to: COVID-19; influenza; RSV; simple viral URI; pneumonia  Patient with URI symptoms and now has evidence of otitis media on exam.  Appears well otherwise.        Final Clinical Impression(s) / ED Diagnoses Final diagnoses:  Acute suppurative otitis media of left ear without spontaneous rupture of tympanic membrane, recurrence not specified  Upper respiratory tract infection, unspecified type    Rx / DC Orders ED Discharge Orders          Ordered    azithromycin (ZITHROMAX) 250 MG tablet  Daily        12/28/22 0136              Gilda Crease, MD 12/28/22 [phone]

## 2023-01-04 ENCOUNTER — Encounter: Payer: Self-pay | Admitting: Obstetrics & Gynecology

## 2023-02-09 ENCOUNTER — Other Ambulatory Visit: Payer: Self-pay | Admitting: Obstetrics & Gynecology

## 2023-02-09 DIAGNOSIS — Z01818 Encounter for other preprocedural examination: Secondary | ICD-10-CM

## 2023-02-09 NOTE — Patient Instructions (Signed)
Christine Ritter  02/09/2023     @PREFPERIOPPHARMACY @   Your procedure is scheduled on  02/16/2023.   Report to [facility] at  0600 A.M.   Call this number if you have problems the morning of surgery:  [phone]  If you experience any cold or flu symptoms such as cough, fever, chills, shortness of breath, etc. between now and your scheduled surgery, please notify us at the above number.   Remember:  Do not eat after midnight.   You may drink clear liquids until  0330  am on 02/16/2023.       Clear liquids allowed are:                    Water, Juice (No red color; non-citric and without pulp; diabetics please choose diet or no sugar options), Carbonated beverages (diabetics please choose diet or no sugar options), Clear Tea (No creamer, milk, or cream, including half & half and powdered creamer), Black Coffee Only (No creamer, milk or cream, including half & half and powdered creamer), Plain Jell-O Only (No red color; diabetics please choose no sugar options), Clear Sports drink (No red color; diabetics please choose diet or no sugar options), and Plain Popsicles Only (No red color; diabetics please choose no sugar options)       At 0330 am on 02/16/2023 drink your carb drink. You can have nothing else to drink after this.    Take these medicines the morning of surgery with A SIP OF WATER                                           None.    Do not wear jewelry, make-up or nail polish, including gel polish,  artificial nails, or any other type of covering on natural nails (fingers and  toes).  Do not wear lotions, powders, or perfumes, or deodorant.  Do not shave 48 hours prior to surgery.  Men may shave face and neck.  Do not bring valuables to the hospital.  [facility] is not responsible for any belongings or valuables.  Contacts, dentures or bridgework may not be worn into surgery.  Leave your suitcase in the car.  After surgery it may be brought to your room.  For  patients admitted to the hospital, discharge time will be determined by your treatment team.  Patients discharged the day of surgery will not be allowed to drive home and must have someone with them for 24 hours.    Special instructions:   DO NOT smoke tobacco or vape for 24 hours before your procedure.  Please read over the following fact sheets that you were given. Pain Booklet, Coughing and Deep Breathing, Blood Transfusion Information, Surgical Site Infection Prevention, Anesthesia Post-op Instructions, and Care and Recovery After Surgery        Total Laparoscopic Hysterectomy, Care After The following information offers guidance on how to care for yourself after your procedure. Your health care provider may also give you more specific instructions. If you have problems or questions, contact your health care provider. What can I expect after the procedure? After the procedure, it is common to have: Pain, bruising, and numbness around your incisions. Tiredness (fatigue). Poor appetite. Less interest in sex. Vaginal discharge or bleeding. You will need to use a sanitary pad after this procedure. Feelings  of sadness or other emotions. If your ovaries were also removed, it is also common to have symptoms of menopause, such as hot flashes, night sweats, and lack of sleep (insomnia). Follow these instructions at home: Medicines Take over-the-counter and prescription medicines only as told by your health care provider. Ask your health care provider if the medicine prescribed to you: Requires you to avoid driving or using machinery. Can cause constipation. You may need to take these actions to prevent or treat constipation: Drink enough fluid to keep your urine pale yellow. Take over-the-counter or prescription medicines. Eat foods that are high in fiber, such as beans, whole grains, and fresh fruits and vegetables. Limit foods that are high in fat and processed sugars, such as fried or  sweet foods. Incision care  Follow instructions from your health care provider about how to take care of your incisions. Make sure you: Wash your hands with soap and water for at least 20 seconds before and after you change your bandage (dressing). If soap and water are not available, use hand sanitizer. Change your dressing as told by your health care provider. Leave stitches (sutures), skin glue, or adhesive strips in place. These skin closures may need to stay in place for 2 weeks or longer. If adhesive strip edges start to loosen and curl up, you may trim the loose edges. Do not remove adhesive strips completely unless your health care provider tells you to do that. Check your incision areas every day for signs of infection. Check for: More redness, swelling, or pain. Fluid or blood. Warmth. Pus or a bad smell. Activity  Rest as told by your health care provider. Avoid sitting for a long time without moving. Get up to take short walks every 1-2 hours. This is important to improve blood flow and breathing. Ask for help if you feel weak or unsteady. Return to your normal activities as told by your health care provider. Ask your health care provider what activities are safe for you. Do not lift anything that is heavier than 10 lb (4.5 kg), or the limit that you are told, for one month after surgery or until your health care provider says that it is safe. If you were given a sedative during the procedure, it can affect you for several hours. Do not drive or operate machinery until your health care provider says that it is safe. Lifestyle Do not use any products that contain nicotine or tobacco. These products include cigarettes, chewing tobacco, and vaping devices, such as e-cigarettes. These can delay healing after surgery. If you need help quitting, ask your health care provider. Do not drink alcohol until your health care provider approves. General instructions  Do not douche, use tampons,  or have sex for at least 6 weeks, or as told by your health care provider. If you struggle with physical or emotional changes after your procedure, speak with your health care provider or a therapist. Do not take baths, swim, or use a hot tub until your health care provider approves. You may only be allowed to take showers for 2-3 weeks. Keep your dressing dry until your health care provider says it can be removed. Try to have someone at home with you for the first 1-2 weeks to help with your daily chores. Wear compression stockings as told by your health care provider. These stockings help to prevent blood clots and reduce swelling in your legs. Keep all follow-up visits. This is important. Contact a health care provider if:  You have any of these signs of infection: Chills or a fever. More redness, swelling, or pain around an incision. Fluid or blood coming from an incision. Warmth coming from an incision. Pus or a bad smell coming from an incision. An incision opens. You feel dizzy or light-headed. You have pain or bleeding when you urinate, or you are unable to urinate. You have abnormal vaginal discharge. You have pain that does not get better with medicine. Get help right away if: You have a fever and your symptoms suddenly get worse. You have severe abdominal pain. You have chest pain or shortness of breath. You faint. You have pain, swelling, or redness in your leg. You have heavy vaginal bleeding with blood clots, soaking through a sanitary pad in less than 1 hour. These symptoms may represent a serious problem that is an emergency. Do not wait to see if the symptoms will go away. Get medical help right away. Call your local emergency services (911 in the [place] Do not drive yourself to the hospital. Summary After the procedure, it is common to have pain and bruising around your incisions. Do not take baths, swim, or use a hot tub until your health care provider approves. Do not  lift anything that is heavier than 10 lb (4.5 kg), or the limit that you are told, for one month after surgery or until your health care provider says that it is safe. Tell your health care provider if you have any signs or symptoms of infection after the procedure. Get help right away if you have severe abdominal pain, chest pain, shortness of breath, or heavy bleeding from your vagina. This information is not intended to replace advice given to you by your health care provider. Make sure you discuss any questions you have with your health care provider. Document Revised: 03/27/2020 Document Reviewed: 03/28/2020 Elsevier Patient Education  2024 [employer] General Anesthesia, Adult, Care After The following information offers guidance on how to care for yourself after your procedure. Your health care provider may also give you more specific instructions. If you have problems or questions, contact your health care provider. What can I expect after the procedure? After the procedure, it is common for people to: Have pain or discomfort at the IV site. Have nausea or vomiting. Have a sore throat or hoarseness. Have trouble concentrating. Feel cold or chills. Feel weak, sleepy, or tired (fatigue). Have soreness and body aches. These can affect parts of the body that were not involved in surgery. Follow these instructions at home: For the time period you were told by your health care provider:  Rest. Do not participate in activities where you could fall or become injured. Do not drive or use machinery. Do not drink alcohol. Do not take sleeping pills or medicines that cause drowsiness. Do not make important decisions or sign legal documents. Do not take care of children on your own. General instructions Drink enough fluid to keep your urine pale yellow. If you have sleep apnea, surgery and certain medicines can increase your risk for breathing problems. Follow instructions from your health  care provider about wearing your sleep device: Anytime you are sleeping, including during daytime naps. While taking prescription pain medicines, sleeping medicines, or medicines that make you drowsy. Return to your normal activities as told by your health care provider. Ask your health care provider what activities are safe for you. Take over-the-counter and prescription medicines only as told by your health care provider. Do not use  any products that contain nicotine or tobacco. These products include cigarettes, chewing tobacco, and vaping devices, such as e-cigarettes. These can delay incision healing after surgery. If you need help quitting, ask your health care provider. Contact a health care provider if: You have nausea or vomiting that does not get better with medicine. You vomit every time you eat or drink. You have pain that does not get better with medicine. You cannot urinate or have bloody urine. You develop a skin rash. You have a fever. Get help right away if: You have trouble breathing. You have chest pain. You vomit blood. These symptoms may be an emergency. Get help right away. Call 911. Do not wait to see if the symptoms will go away. Do not drive yourself to the hospital. Summary After the procedure, it is common to have a sore throat, hoarseness, nausea, vomiting, or to feel weak, sleepy, or fatigue. For the time period you were told by your health care provider, do not drive or use machinery. Get help right away if you have difficulty breathing, have chest pain, or vomit blood. These symptoms may be an emergency. This information is not intended to replace advice given to you by your health care provider. Make sure you discuss any questions you have with your health care provider. Document Revised: 10/23/2021 Document Reviewed: 10/23/2021 Elsevier Patient Education  2024 [employer] How to Use Chlorhexidine Before Surgery Chlorhexidine gluconate (CHG) is a  germ-killing (antiseptic) solution that is used to clean the skin. It can get rid of the bacteria that normally live on the skin and can keep them away for about 24 hours. To clean your skin with CHG, you may be given: A CHG solution to use in the shower or as part of a sponge bath. A prepackaged cloth that contains CHG. Cleaning your skin with CHG may help lower the risk for infection: While you are staying in the intensive care unit of the hospital. If you have a vascular access, such as a central line, to provide short-term or long-term access to your veins. If you have a catheter to drain urine from your bladder. If you are on a ventilator. A ventilator is a machine that helps you breathe by moving air in and out of your lungs. After surgery. What are the risks? Risks of using CHG include: A skin reaction. Hearing loss, if CHG gets in your ears and you have a perforated eardrum. Eye injury, if CHG gets in your eyes and is not rinsed out. The CHG product catching fire. Make sure that you avoid smoking and flames after applying CHG to your skin. Do not use CHG: If you have a chlorhexidine allergy or have previously reacted to chlorhexidine. On babies younger than 82 months of age. How to use CHG solution Use CHG only as told by your health care provider, and follow the instructions on the label. Use the full amount of CHG as directed. Usually, this is one bottle. During a shower Follow these steps when using CHG solution during a shower (unless your health care provider gives you different instructions): Start the shower. Use your normal soap and shampoo to wash your face and hair. Turn off the shower or move out of the shower stream. Pour the CHG onto a clean washcloth. Do not use any type of brush or rough-edged sponge. Starting at your neck, lather your body down to your toes. Make sure you follow these instructions: If you will be having surgery, pay special  attention to the part of  your body where you will be having surgery. Scrub this area for at least 1 minute. Do not use CHG on your head or face. If the solution gets into your ears or eyes, rinse them well with water. Avoid your genital area. Avoid any areas of skin that have broken skin, cuts, or scrapes. Scrub your back and under your arms. Make sure to wash skin folds. Let the lather sit on your skin for 1-2 minutes or as long as told by your health care provider. Thoroughly rinse your entire body in the shower. Make sure that all body creases and crevices are rinsed well. Dry off with a clean towel. Do not put any substances on your body afterward--such as powder, lotion, or perfume--unless you are told to do so by your health care provider. Only use lotions that are recommended by the manufacturer. Put on clean clothes or pajamas. If it is the night before your surgery, sleep in clean sheets.  During a sponge bath Follow these steps when using CHG solution during a sponge bath (unless your health care provider gives you different instructions): Use your normal soap and shampoo to wash your face and hair. Pour the CHG onto a clean washcloth. Starting at your neck, lather your body down to your toes. Make sure you follow these instructions: If you will be having surgery, pay special attention to the part of your body where you will be having surgery. Scrub this area for at least 1 minute. Do not use CHG on your head or face. If the solution gets into your ears or eyes, rinse them well with water. Avoid your genital area. Avoid any areas of skin that have broken skin, cuts, or scrapes. Scrub your back and under your arms. Make sure to wash skin folds. Let the lather sit on your skin for 1-2 minutes or as long as told by your health care provider. Using a different clean, wet washcloth, thoroughly rinse your entire body. Make sure that all body creases and crevices are rinsed well. Dry off with a clean towel. Do not  put any substances on your body afterward--such as powder, lotion, or perfume--unless you are told to do so by your health care provider. Only use lotions that are recommended by the manufacturer. Put on clean clothes or pajamas. If it is the night before your surgery, sleep in clean sheets. How to use CHG prepackaged cloths Only use CHG cloths as told by your health care provider, and follow the instructions on the label. Use the CHG cloth on clean, dry skin. Do not use the CHG cloth on your head or face unless your health care provider tells you to. When washing with the CHG cloth: Avoid your genital area. Avoid any areas of skin that have broken skin, cuts, or scrapes. Before surgery Follow these steps when using a CHG cloth to clean before surgery (unless your health care provider gives you different instructions): Using the CHG cloth, vigorously scrub the part of your body where you will be having surgery. Scrub using a back-and-forth motion for 3 minutes. The area on your body should be completely wet with CHG when you are done scrubbing. Do not rinse. Discard the cloth and let the area air-dry. Do not put any substances on the area afterward, such as powder, lotion, or perfume. Put on clean clothes or pajamas. If it is the night before your surgery, sleep in clean sheets.  For general bathing Follow  these steps when using CHG cloths for general bathing (unless your health care provider gives you different instructions). Use a separate CHG cloth for each area of your body. Make sure you wash between any folds of skin and between your fingers and toes. Wash your body in the following order, switching to a new cloth after each step: The front of your neck, shoulders, and chest. Both of your arms, under your arms, and your hands. Your stomach and groin area, avoiding the genitals. Your right leg and foot. Your left leg and foot. The back of your neck, your back, and your buttocks. Do not  rinse. Discard the cloth and let the area air-dry. Do not put any substances on your body afterward--such as powder, lotion, or perfume--unless you are told to do so by your health care provider. Only use lotions that are recommended by the manufacturer. Put on clean clothes or pajamas. Contact a health care provider if: Your skin gets irritated after scrubbing. You have questions about using your solution or cloth. You swallow any chlorhexidine. Call your local poison control center (640-407-6800 in the [place] Get help right away if: Your eyes itch badly, or they become very red or swollen. Your skin itches badly and is red or swollen. Your hearing changes. You have trouble seeing. You have swelling or tingling in your mouth or throat. You have trouble breathing. These symptoms may represent a serious problem that is an emergency. Do not wait to see if the symptoms will go away. Get medical help right away. Call your local emergency services (911 in the [place] Do not drive yourself to the hospital. Summary Chlorhexidine gluconate (CHG) is a germ-killing (antiseptic) solution that is used to clean the skin. Cleaning your skin with CHG may help to lower your risk for infection. You may be given CHG to use for bathing. It may be in a bottle or in a prepackaged cloth to use on your skin. Carefully follow your health care provider's instructions and the instructions on the product label. Do not use CHG if you have a chlorhexidine allergy. Contact your health care provider if your skin gets irritated after scrubbing. This information is not intended to replace advice given to you by your health care provider. Make sure you discuss any questions you have with your health care provider. Document Revised: 11/23/2021 Document Reviewed: 10/06/2020 Elsevier Patient Education  2023 [employer].

## 2023-02-14 ENCOUNTER — Encounter (HOSPITAL_COMMUNITY)
Admission: RE | Admit: 2023-02-14 | Discharge: 2023-02-14 | Disposition: A | Discharge status: Home / Self Care | Payer: 59 | Source: Ambulatory Visit | Attending: Obstetrics & Gynecology | Admitting: Obstetrics & Gynecology

## 2023-02-14 ENCOUNTER — Encounter (HOSPITAL_COMMUNITY): Payer: Self-pay

## 2023-02-14 DIAGNOSIS — Z01812 Encounter for preprocedural laboratory examination: Secondary | ICD-10-CM | POA: Diagnosis present

## 2023-02-14 DIAGNOSIS — Z01818 Encounter for other preprocedural examination: Secondary | ICD-10-CM

## 2023-02-14 HISTORY — DX: Other specified postprocedural states: Z98.890

## 2023-02-14 LAB — COMPREHENSIVE METABOLIC PANEL
ALT: 26 U/L (ref 0–44)
AST: 20 U/L (ref 15–41)
Albumin: 4.3 g/dL (ref 3.5–5.0)
Alkaline Phosphatase: 68 U/L (ref 38–126)
Anion gap: 8 (ref 5–15)
BUN: 11 mg/dL (ref 6–20)
CO2: 24 mmol/L (ref 22–32)
Calcium: 9.2 mg/dL (ref 8.9–10.3)
Chloride: 104 mmol/L (ref 98–111)
Creatinine, Ser: 0.56 mg/dL (ref 0.44–1.00)
GFR, Estimated: >60 mL/min (ref >60)
Glucose, Bld: 86 mg/dL (ref 70–99)
Potassium: 4.2 mmol/L (ref 3.5–5.1)
Sodium: 136 mmol/L (ref 135–145)
Total Bilirubin: 0.7 mg/dL (ref 0.3–1.2)
Total Protein: 7.6 g/dL (ref 6.5–8.1)

## 2023-02-14 LAB — URINALYSIS, ROUTINE W REFLEX MICROSCOPIC
Bilirubin Urine: NEGATIVE
Glucose, UA: NEGATIVE mg/dL
Hgb urine dipstick: NEGATIVE
Ketones, ur: NEGATIVE mg/dL
Nitrite: NEGATIVE
Protein, ur: NEGATIVE mg/dL
Specific Gravity, Urine: 1.016 (ref 1.005–1.030)
pH: 5 (ref 5.0–8.0)

## 2023-02-14 LAB — CBC
HCT: 41.3 % (ref 36.0–46.0)
Hemoglobin: 13.3 g/dL (ref 12.0–15.0)
MCH: 26.7 pg (ref 26.0–34.0)
MCHC: 32.2 g/dL (ref 30.0–36.0)
MCV: 82.9 fL (ref 80.0–100.0)
Platelets: 222 10*3/uL (ref 150–400)
RBC: 4.98 MIL/uL (ref 3.87–5.11)
RDW: 15.9 % — ABNORMAL HIGH (ref 11.5–15.5)
WBC: 5.4 10*3/uL (ref 4.0–10.5)
nRBC: 0 % (ref 0.0–0.2)

## 2023-02-14 LAB — RAPID HIV SCREEN (HIV 1/2 AB+AG)
HIV 1/2 Antibodies: NONREACTIVE
HIV-1 P24 Antigen - HIV24: NONREACTIVE

## 2023-02-14 LAB — PREGNANCY, URINE: Preg Test, Ur: NEGATIVE

## 2023-02-15 MED ORDER — CLINDAMYCIN PHOSPHATE 900 MG/50ML IV SOLN
900.0000 mg | INTRAVENOUS | Status: AC
  Administered 2023-02-16: 900 mg via INTRAVENOUS
  Filled 2023-02-15: qty 50

## 2023-02-15 MED ORDER — GENTAMICIN SULFATE 40 MG/ML IJ SOLN
5.0000 mg/kg | INTRAVENOUS | Status: AC
  Administered 2023-02-16: 440 mg via INTRAVENOUS
  Filled 2023-02-15: qty 11

## 2023-02-16 ENCOUNTER — Ambulatory Visit (HOSPITAL_BASED_OUTPATIENT_CLINIC_OR_DEPARTMENT_OTHER): Payer: 59 | Admitting: Anesthesiology

## 2023-02-16 ENCOUNTER — Ambulatory Visit (HOSPITAL_COMMUNITY): Payer: Self-pay | Admitting: Anesthesiology

## 2023-02-16 ENCOUNTER — Other Ambulatory Visit: Payer: Self-pay

## 2023-02-16 ENCOUNTER — Ambulatory Visit (HOSPITAL_COMMUNITY)
Admission: RE | Admit: 2023-02-16 | Discharge: 2023-02-16 | Disposition: A | Discharge status: Home / Self Care | Payer: 59 | Attending: Obstetrics & Gynecology | Admitting: Obstetrics & Gynecology

## 2023-02-16 ENCOUNTER — Encounter (HOSPITAL_COMMUNITY)
Admission: RE | Disposition: A | Discharge status: Home / Self Care | Payer: Self-pay | Source: Home / Self Care | Attending: Obstetrics & Gynecology

## 2023-02-16 DIAGNOSIS — N920 Excessive and frequent menstruation with regular cycle: Secondary | ICD-10-CM | POA: Diagnosis not present

## 2023-02-16 DIAGNOSIS — F419 Anxiety disorder, unspecified: Secondary | ICD-10-CM | POA: Insufficient documentation

## 2023-02-16 DIAGNOSIS — N879 Dysplasia of cervix uteri, unspecified: Secondary | ICD-10-CM | POA: Insufficient documentation

## 2023-02-16 DIAGNOSIS — N921 Excessive and frequent menstruation with irregular cycle: Secondary | ICD-10-CM

## 2023-02-16 DIAGNOSIS — N3289 Other specified disorders of bladder: Secondary | ICD-10-CM

## 2023-02-16 DIAGNOSIS — N72 Inflammatory disease of cervix uteri: Secondary | ICD-10-CM | POA: Insufficient documentation

## 2023-02-16 DIAGNOSIS — N946 Dysmenorrhea, unspecified: Secondary | ICD-10-CM | POA: Diagnosis not present

## 2023-02-16 HISTORY — PX: ROBOTIC ASSISTED LAPAROSCOPIC HYSTERECTOMY AND SALPINGECTOMY: SHX6379

## 2023-02-16 SURGERY — XI ROBOTIC ASSISTED LAPAROSCOPIC HYSTERECTOMY AND SALPINGECTOMY
Anesthesia: General | Site: Abdomen

## 2023-02-16 MED ORDER — DEXAMETHASONE SODIUM PHOSPHATE 10 MG/ML IJ SOLN
INTRAMUSCULAR | Status: DC | PRN
  Administered 2023-02-16: 10 mg via INTRAVENOUS

## 2023-02-16 MED ORDER — LIDOCAINE HCL (PF) 2 % IJ SOLN
INTRAMUSCULAR | Status: AC
  Filled 2023-02-16: qty 5

## 2023-02-16 MED ORDER — MIDAZOLAM HCL 2 MG/2ML IJ SOLN
INTRAMUSCULAR | Status: AC
  Filled 2023-02-16: qty 2

## 2023-02-16 MED ORDER — HYDROMORPHONE HCL 1 MG/ML IJ SOLN
INTRAMUSCULAR | Status: DC | PRN
  Administered 2023-02-16 (×2): .5 mg via INTRAVENOUS

## 2023-02-16 MED ORDER — PHENYLEPHRINE 80 MCG/ML (10ML) SYRINGE FOR IV PUSH (FOR BLOOD PRESSURE SUPPORT)
PREFILLED_SYRINGE | INTRAVENOUS | Status: DC | PRN
  Administered 2023-02-16 (×2): 80 ug via INTRAVENOUS

## 2023-02-16 MED ORDER — PROPOFOL 10 MG/ML IV BOLUS
INTRAVENOUS | Status: DC | PRN
  Administered 2023-02-16: 240 mg via INTRAVENOUS

## 2023-02-16 MED ORDER — CIPROFLOXACIN HCL 500 MG PO TABS: 500.0000 mg | ORAL_TABLET | Freq: Two times a day (BID) | ORAL | 0 refills | Status: DC

## 2023-02-16 MED ORDER — DEXAMETHASONE SODIUM PHOSPHATE 10 MG/ML IJ SOLN
INTRAMUSCULAR | Status: AC
  Filled 2023-02-16: qty 1

## 2023-02-16 MED ORDER — SCOPOLAMINE 1 MG/3DAYS TD PT72
MEDICATED_PATCH | TRANSDERMAL | Status: AC
  Filled 2023-02-16: qty 1

## 2023-02-16 MED ORDER — KETOROLAC TROMETHAMINE 30 MG/ML IJ SOLN
INTRAMUSCULAR | Status: AC
  Filled 2023-02-16: qty 1

## 2023-02-16 MED ORDER — ONDANSETRON HCL 4 MG/2ML IJ SOLN
INTRAMUSCULAR | Status: AC
  Filled 2023-02-16: qty 2

## 2023-02-16 MED ORDER — PROPOFOL 500 MG/50ML IV EMUL
INTRAVENOUS | Status: DC | PRN
  Administered 2023-02-16: 150 ug/kg/min via INTRAVENOUS

## 2023-02-16 MED ORDER — MIDAZOLAM HCL 2 MG/2ML IJ SOLN
INTRAMUSCULAR | Status: DC | PRN
  Administered 2023-02-16: 2 mg via INTRAVENOUS
  Administered 2023-02-16: 1 mg via INTRAVENOUS

## 2023-02-16 MED ORDER — KETOROLAC TROMETHAMINE 30 MG/ML IJ SOLN
30.0000 mg | Freq: Once | INTRAMUSCULAR | Status: AC
  Administered 2023-02-16: 30 mg via INTRAVENOUS

## 2023-02-16 MED ORDER — OXYCODONE-ACETAMINOPHEN 7.5-325 MG PO TABS: 1.0000 | ORAL_TABLET | Freq: Four times a day (QID) | ORAL | 0 refills | Status: DC | PRN

## 2023-02-16 MED ORDER — LIDOCAINE HCL (CARDIAC) PF 100 MG/5ML IV SOSY
PREFILLED_SYRINGE | INTRAVENOUS | Status: DC | PRN
  Administered 2023-02-16: 80 mg via INTRATRACHEAL

## 2023-02-16 MED ORDER — ONDANSETRON HCL 4 MG/2ML IJ SOLN
INTRAMUSCULAR | Status: DC | PRN
  Administered 2023-02-16: 4 mg via INTRAVENOUS

## 2023-02-16 MED ORDER — MEPERIDINE HCL 50 MG/ML IJ SOLN: 6.2500 mg | INTRAMUSCULAR | Status: DC | PRN

## 2023-02-16 MED ORDER — FENTANYL CITRATE (PF) 100 MCG/2ML IJ SOLN
INTRAMUSCULAR | Status: DC | PRN
  Administered 2023-02-16: 50 ug via INTRAVENOUS
  Administered 2023-02-16: 100 ug via INTRAVENOUS
  Administered 2023-02-16: 50 ug via INTRAVENOUS

## 2023-02-16 MED ORDER — ORAL CARE MOUTH RINSE: 15.0000 mL | Freq: Once | OROMUCOSAL | Status: AC

## 2023-02-16 MED ORDER — BUPIVACAINE HCL (PF) 0.25 % IJ SOLN
INTRAMUSCULAR | Status: AC
  Filled 2023-02-16: qty 60

## 2023-02-16 MED ORDER — ONDANSETRON 8 MG PO TBDP: 8.0000 mg | ORAL_TABLET | Freq: Three times a day (TID) | ORAL | 0 refills | Status: DC | PRN

## 2023-02-16 MED ORDER — FENTANYL CITRATE (PF) 100 MCG/2ML IJ SOLN
INTRAMUSCULAR | Status: AC
  Filled 2023-02-16: qty 2

## 2023-02-16 MED ORDER — HYDROMORPHONE HCL 1 MG/ML IJ SOLN: 0.2500 mg | INTRAMUSCULAR | Status: DC | PRN

## 2023-02-16 MED ORDER — CHLORHEXIDINE GLUCONATE 0.12 % MT SOLN
15.0000 mL | Freq: Once | OROMUCOSAL | Status: AC
  Administered 2023-02-16: 15 mL via OROMUCOSAL

## 2023-02-16 MED ORDER — HYDROMORPHONE HCL 1 MG/ML IJ SOLN
INTRAMUSCULAR | Status: AC
  Filled 2023-02-16: qty 1

## 2023-02-16 MED ORDER — STERILE WATER FOR IRRIGATION IR SOLN
Status: DC | PRN
  Administered 2023-02-16: 1000 mL

## 2023-02-16 MED ORDER — SUGAMMADEX SODIUM 200 MG/2ML IV SOLN
INTRAVENOUS | Status: DC | PRN
  Administered 2023-02-16: 200 mg via INTRAVENOUS

## 2023-02-16 MED ORDER — PROPOFOL 500 MG/50ML IV EMUL
INTRAVENOUS | Status: AC
  Filled 2023-02-16: qty 50

## 2023-02-16 MED ORDER — ROCURONIUM BROMIDE 10 MG/ML (PF) SYRINGE
PREFILLED_SYRINGE | INTRAVENOUS | Status: AC
  Filled 2023-02-16: qty 10

## 2023-02-16 MED ORDER — KETOROLAC TROMETHAMINE 10 MG PO TABS: 10.0000 mg | ORAL_TABLET | Freq: Three times a day (TID) | ORAL | 0 refills | Status: DC | PRN

## 2023-02-16 MED ORDER — PROPOFOL 500 MG/50ML IV EMUL
INTRAVENOUS | Status: AC
  Filled 2023-02-16: qty 200

## 2023-02-16 MED ORDER — BUPIVACAINE HCL 0.25 % IJ SOLN
INTRAMUSCULAR | Status: DC | PRN
  Administered 2023-02-16: 40 mL

## 2023-02-16 MED ORDER — ROCURONIUM BROMIDE 10 MG/ML (PF) SYRINGE
PREFILLED_SYRINGE | INTRAVENOUS | Status: DC | PRN
  Administered 2023-02-16: 30 mg via INTRAVENOUS
  Administered 2023-02-16: 40 mg via INTRAVENOUS
  Administered 2023-02-16: 60 mg via INTRAVENOUS

## 2023-02-16 MED ORDER — SCOPOLAMINE 1 MG/3DAYS TD PT72
1.0000 | MEDICATED_PATCH | Freq: Once | TRANSDERMAL | Status: DC
  Administered 2023-02-16: 1.5 mg via TRANSDERMAL

## 2023-02-16 MED ORDER — ONDANSETRON HCL 4 MG/2ML IJ SOLN: 4.0000 mg | Freq: Once | INTRAMUSCULAR | Status: DC | PRN

## 2023-02-16 MED ORDER — PROPOFOL 10 MG/ML IV BOLUS
INTRAVENOUS | Status: AC
  Filled 2023-02-16: qty 20

## 2023-02-16 MED ORDER — POVIDONE-IODINE 10 % EX SWAB: 2.0000 | Freq: Once | CUTANEOUS | Status: DC

## 2023-02-16 MED ORDER — LACTATED RINGERS IV SOLN: INTRAVENOUS | Status: DC

## 2023-02-16 SURGICAL SUPPLY — 58 items
ADH SKN CLS APL DERMABOND .7 (GAUZE/BANDAGES/DRESSINGS) ×1
APL SWBSTK 6 STRL LF DISP (MISCELLANEOUS) ×1
APPLICATOR COTTON TIP 6 STRL (MISCELLANEOUS) IMPLANT
APPLICATOR COTTON TIP 6IN STRL (MISCELLANEOUS) ×1
BLADE SURG SZ10 CARB STEEL (BLADE) IMPLANT
BLADE SURG SZ11 CARB STEEL (BLADE) ×1 IMPLANT
CAUTERY HOOK MNPLR 1.6 DVNC XI (INSTRUMENTS) ×1 IMPLANT
COVER LIGHT HANDLE STERIS (MISCELLANEOUS) ×2 IMPLANT
COVER MAYO STAND XLG (MISCELLANEOUS) ×1 IMPLANT
DERMABOND ADVANCED .7 DNX12 (GAUZE/BANDAGES/DRESSINGS) ×1 IMPLANT
DRAPE ARM DVNC X/XI (DISPOSABLE) ×4 IMPLANT
DRAPE COLUMN DVNC XI (DISPOSABLE) ×1 IMPLANT
DRIVER NDL MEGA SUTCUT DVNCXI (INSTRUMENTS) ×1 IMPLANT
DRIVER NDLE MEGA SUTCUT DVNCXI (INSTRUMENTS) ×1 IMPLANT
ELECT REM PT RETURN 9FT ADLT (ELECTROSURGICAL) ×1
ELECTRODE REM PT RTRN 9FT ADLT (ELECTROSURGICAL) ×1 IMPLANT
FORCEPS PROGRASP DVNC XI (FORCEP) ×1 IMPLANT
GAUZE 4X4 16PLY ~~LOC~~+RFID DBL (SPONGE) ×2 IMPLANT
GLOVE BIO SURGEON STRL SZ7 (GLOVE) IMPLANT
GLOVE BIOGEL PI IND STRL 7.0 (GLOVE) ×3 IMPLANT
GLOVE BIOGEL PI IND STRL 7.5 (GLOVE) IMPLANT
GLOVE BIOGEL PI IND STRL 8 (GLOVE) ×2 IMPLANT
GLOVE ECLIPSE 6.5 STRL STRAW (GLOVE) IMPLANT
GLOVE ECLIPSE 8.0 STRL XLNG CF (GLOVE) ×3 IMPLANT
GOWN STRL REUS W/TWL LRG LVL3 (GOWN DISPOSABLE) ×2 IMPLANT
GOWN STRL REUS W/TWL XL LVL3 (GOWN DISPOSABLE) ×2 IMPLANT
GYRUS RUMI II 4.0CM BLUE (DISPOSABLE) ×1
KIT PINK PAD W/HEAD ARE REST (MISCELLANEOUS) ×1
KIT PINK PAD W/HEAD ARM REST (MISCELLANEOUS) ×1 IMPLANT
KIT TURNOVER CYSTO (KITS) ×1 IMPLANT
MANIFOLD NEPTUNE II (INSTRUMENTS) ×1 IMPLANT
NDL HYPO 21X1.5 SAFETY (NEEDLE) ×1 IMPLANT
NDL INSUFFLATION 14GA 120MM (NEEDLE) ×1 IMPLANT
NEEDLE HYPO 21X1.5 SAFETY (NEEDLE) ×1 IMPLANT
NEEDLE INSUFFLATION 14GA 120MM (NEEDLE) ×1 IMPLANT
NS IRRIG 500ML POUR BTL (IV SOLUTION) ×1 IMPLANT
OBTURATOR OPTICAL STND 8 DVNC (TROCAR) ×1
OBTURATOR OPTICALSTD 8 DVNC (TROCAR) ×1 IMPLANT
PACK PERI GYN (CUSTOM PROCEDURE TRAY) ×1 IMPLANT
RUMI II GYRUS 4.0CM BLUE (DISPOSABLE) IMPLANT
SEAL CANN UNIV 5-8 DVNC XI (MISCELLANEOUS) ×3 IMPLANT
SEALER VESSEL EXT DVNC XI (MISCELLANEOUS) ×1 IMPLANT
SET BASIN LINEN APH (SET/KITS/TRAYS/PACK) ×1 IMPLANT
SET TRI-LUMEN FLTR TB AIRSEAL (TUBING) ×1 IMPLANT
SOL ANTI FOG 6CC (MISCELLANEOUS) ×1 IMPLANT
SPONGE T-LAP 18X18 ~~LOC~~+RFID (SPONGE) ×1 IMPLANT
SUT STRATAFIX 0 PDS+ CT-2 23 (SUTURE) ×1
SUT VICRYL 0 UR6 27IN ABS (SUTURE) ×1 IMPLANT
SUT VICRYL AB 3-0 FS1 BRD 27IN (SUTURE) ×2 IMPLANT
SUTURE STRATFX 0 PDS+ CT-2 23 (SUTURE) ×1 IMPLANT
SYR 10ML LL (SYRINGE) ×2 IMPLANT
SYR 50ML LL SCALE MARK (SYRINGE) ×2 IMPLANT
SYR CONTROL 10ML LL (SYRINGE) ×2 IMPLANT
TIP UTERINE 6.7X10CM GRN DISP (MISCELLANEOUS) IMPLANT
TRAY FOLEY W/BAG SLVR 16FR (SET/KITS/TRAYS/PACK) ×1
TRAY FOLEY W/BAG SLVR 16FR ST (SET/KITS/TRAYS/PACK) ×1 IMPLANT
TROCAR PORT AIRSEAL 8X120 (TROCAR) ×1 IMPLANT
WATER STERILE IRR 500ML POUR (IV SOLUTION) ×1 IMPLANT

## 2023-02-16 NOTE — Op Note (Signed)
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   Procedure: Xi Robotic hysterectomy with removal of both tubes  Laparoscopic guided TAP block   Surgeon: Lazaro Arms, MD   Anesthesia: General endotracheal   Findings: Bulky soft uterus Ovaries tubes and peritoneum all normal   Description of operation: Patient was taken to the operating room and placed in the low lithotomy position She was prepped and draped in the usual sterile fashion robotic assisted laparoscopic procedure A Foley catheter was placed The vagina was once again prepped additionally   A 10 cm RUMI II with 4 cm cup was placed for uterine manipulation and colpotomy delineation   An incision was made above the umbilicus The umbilical fascia was grasped A varies needle was used and placed into the peritoneum with 1 pass A pneumoperitoneum was created to a pressure of 15   An 8 mm port was placed into the peritoneal cavity using a nonbladed trocar easily with 1 pass using the video laparoscope The peritoneal cavity was confirmed   There were no unusual findings Minor adhesions of the bladder to the anterior abdominal wall were present   3 additional 8 mm ports were placed at approximately the same level as the supraumbilical port 2 of the ports were robotic and 1 is an air seal assist port   One was left lateral and 1 was placed right lateral, both lateral to the rectus anterior muscle These were placed under direct visualization without difficulty into the peritoneal cavity Nonbladed trocars were used in all instances The air seal assist port was placed 8 cm to the left of the umbilical incision and 3 cm above the umbilical incision   The patient was placed in 24 degrees of Trendelenburg   The [employer] robot was then docked to the 3 robotic ports   Instruments used during the robotic hysterectomy: Vessel  sealer extender using bipolar energy ProGrasp forceps with no energy Monopolar hook Megacut needle driver 2-0 PDS symmetrical STRATAFIX on a CT 2 needle   I then left the patient and went to the surgical console   The RUMI II + ALLY was used throughout the case to apply traction and anterior/posterior displacement of the uterus to facilitate the robotic procedure The ProGrasp was used to put traction on the left adnexa The left ureter was identified and found to be well away from the adnexal vessels  The vessel sealer extender using bipolar energy was used and the utero-ovarian ligament was coagulated and then transected I used traction medially and anteriorly and used the vessel sealer to take the broad ligament and round ligament down to the level of the cervical isthmus just lateral to the left uterine vessels   I then turned my attention to the right adnexa The pro grasp was used and medial and upward traction was placed The vessel sealer extender using bipolar energy was used to coagulate and ligate the right utero ovarian  ligament vessels Again the right ureter was well inferior to the vessels I continued use medial and anterior retraction using the ProGrasp and the vessel sealer with the bipolar energy was used to take the broad ligament and round ligament down to the level of the cervical isthmus and lateral to the uterine vessels   I then placed the monopolar hook in the place of the ProGrasp The vessel sealer extender was used to grasp the peritoneum of the bladder provide  traction, of course no energy was used for this I used the monopolar hook with energy to open of the peritoneal leaf anteriorly and dissect the bladder peritoneum off the lower uterine segment and cervix beyond the level of the vaginal colpotomy cup I then used the monopolar hook to take the peritoneum down where I stopped using the vessel sealer and met in the bladder dissection bilaterally   The vessel sealer  extender with monopolar energy was used to coagulate the uterine vessels at the level of the cervical isthmus bilaterally and then just above and just below to manage backbleeding The uterine vessels were transected There was good hemostasis I then stayed medial to this uterine vessel pedicle with the vessel sealer extender and took down the paracervical tissue to allow colpotomy incision using the monopolar hook, preserving the cardinal ligament Again there was good hemostasis bilaterally   I then used the monopolar hook and made a posterior colpotomy incision above the level of insertion of the uterosacral ligaments I used a frowny face then smiley face technique and opened the vagina to approximately 8:00 and 4:00 posteriorly I then used the vessel sealer extender with bipolar energy, coagulating and then transecting the vagina   The monopolar hook was used to make the anterior colpotomy incision as the bladder had been dissected well past the colpotomy ring Cephalad tension was placed on the RUMI II handle throughout this portion of the procedure to prevent thermal injury to the bladder and safe distance from the ureters bilaterally Colpotomy incision was made from 10:00 to 2:00 The vessel sealer with bipolar energy was then used to coagulate and transect the vagina, again inside of the cardinal ligament  Bilateral salpingectomy was performed hemostatically   The uterus and tubes was removed through the vagina  A wet lap tape was placed in the vagina to maintain pneumoperitoneum   All pedicles were found to be hemostatic there was very little blood loss 70 cc total most of which was back bleeding squeezed out of the uterus  I then removed the vessel sealer and monopolar hook The pro grasp was replaced and the needle driver was also placed along with the suture   The vagina was closed using the 2-0 PDS STRATAFIX symmetrical suture on the CT 2 needle I pay close attention to the vaginal  corners bilaterally and incorporated those in the closure 1.5 cm of vagina was operating to the vaginal closure I also fixed the uterosacral ligaments to the vaginal cuff repair shortening the uterosacral ligaments  The cardinal ligaments were intact again improving postoperative vaginal support Pubocervical rectovaginal and posterior peritoneum were all included in the vaginal closure There was good result hemostasis   At the end of the procedure all the pedicles were identified and found to be hemostatic Both ureters were identified and undergoing normal peristalsis, they were well out of the way of surgical field throughout   I then got up from the console and went back to the side of the patient after gowning and gloving sterilely  Laparoscopic TAP block was placed at T7 and T10 bilaterally with 10 cc 0.25% marcaine plain in the usual manner using Doyle's bubble technique   The 3 robotic ports were removed The air seal port was used to actively desufflate the peritoneal cavity to a pressure of 0 Air seal port was then removed   The subcutaneous tissue of all 4 incisions was closed using 0 Vicryl All 4 skin incisions were closed using 3-0  vicryl in a subcuticular manner Dermabond was used all 4 incision sites  Each incision was dressed   The patient tolerated the procedure well She received gentamicin and clindamycin IV and 30 mg of Toradol preoperatively   EBL: 70 cc   Lazaro Arms, MD 02/16/2023 10:44 AM

## 2023-02-16 NOTE — Anesthesia Procedure Notes (Signed)
Procedure Name: Intubation Date/Time: 02/16/2023 7:44 AM  Performed by: Oletha Cruel, CRNAPre-anesthesia Checklist: Patient identified, Emergency Drugs available, Suction available, Patient being monitored and Timeout performed Patient Re-evaluated:Patient Re-evaluated prior to induction Oxygen Delivery Method: Circle system utilized Preoxygenation: Pre-oxygenation with 100% oxygen Induction Type: IV induction Ventilation: Mask ventilation without difficulty Grade View: Grade III Tube type: Oral Tube size: 7.5 mm Number of attempts: 1 Airway Equipment and Method: Stylet Placement Confirmation: ETT inserted through vocal cords under direct vision, positive ETCO2 and breath sounds checked- equal and bilateral Secured at: 22 cm Tube secured with: Tape Dental Injury: Teeth and Oropharynx as per pre-operative assessment  Comments: Anterior view.

## 2023-02-16 NOTE — Progress Notes (Signed)
Informed Dr Despina Hidden that a type and screen had been drawn. Dr Despina Hidden stated not needed and the order was canceled.

## 2023-02-16 NOTE — H&P (Signed)
Preoperative History and Physical  Christine Ritter is a 42 y.o. [id number] with No LMP recorded. admitted for a  Xi robot assisted total laparoscopic hysterectomy with removal of both Fallopian tubes.   Pt has had heavy and long periods for quite some time We improved the volume with lo lo estrin cutting it down to 13 days with less volume Much improved on megestrol cyclically 24/4  however the megestrol made her feel "not herself" and her volume is so heavy without it Sonogram 240 cc Discussed all conservative options and pt opts for definitve surgical management  PMH:    Past Medical History:  Diagnosis Date   Anemia    Anxiety    Back pain    PONV (postoperative nausea and vomiting)    Pregnancy     PSH:     Past Surgical History:  Procedure Laterality Date   BACK SURGERY     CESAREAN SECTION     CESAREAN SECTION N/A 12/10/2020   Procedure: CESAREAN SECTION;  Surgeon: Lazaro Arms, MD;  Location: [facility]S;  Service: Obstetrics;  Laterality: N/A;   WISDOM TOOTH EXTRACTION      POb/GynH:      OB History     Gravida  3   Para  3   Term  3   Preterm      AB      Living  3      SAB      IAB      Ectopic      Multiple  0   Live Births  3           SH:   Social History   Tobacco Use   Smoking status: Never   Smokeless tobacco: Never  Vaping Use   Vaping Use: Never used  Substance Use Topics   Alcohol use: Yes    Comment: once a month   Drug use: Not Currently    FH:    Family History  Problem Relation Age of Onset   Atrial fibrillation Paternal Grandfather    Diabetes Maternal Grandmother    Migraines Father    Diabetes Mother    Atrial fibrillation Mother    Obesity Brother      Allergies:  Allergies  Allergen Reactions   Penicillins Anaphylaxis and Rash    Reaction: Childhood    Medications:       Current Facility-Administered Medications:    chlorhexidine (PERIDEX) 0.12 % solution 15 mL, 15 mL, Mouth/Throat, Once **OR**  Oral care mouth rinse, 15 mL, Mouth Rinse, Once, Battula, Rajamani C, MD   clindamycin (CLEOCIN) IVPB 900 mg, 900 mg, Intravenous, 60 min Pre-Op **AND** gentamicin (GARAMYCIN) 440 mg in dextrose 5 % 100 mL IVPB, 5 mg/kg (Adjusted), Intravenous, 60 min Pre-Op, Breezie Micucci, Amaryllis Dyke, MD   ketorolac (TORADOL) 30 MG/ML injection 30 mg, 30 mg, Intravenous, Once, Emmanuela Ghazi, Amaryllis Dyke, MD   ketorolac (TORADOL) 30 MG/ML injection, , , ,    lactated ringers infusion, , Intravenous, Continuous, Battula, Rajamani C, MD   povidone-iodine 10 % swab 2 Application, 2 Application, Topical, Once, Lazaro Arms, MD   scopolamine (TRANSDERM-SCOP) 1 MG/3DAYS 1.5 mg, 1 patch, Transdermal, Once, Battula, Rajamani C, MD   scopolamine (TRANSDERM-SCOP) 1 MG/3DAYS, , , ,   Review of Systems:   Review of Systems  Constitutional: Negative for fever, chills, weight loss, malaise/fatigue and diaphoresis.  HENT: Negative for hearing loss, ear pain, nosebleeds, congestion, sore throat, neck pain, tinnitus  and ear discharge.   Eyes: Negative for blurred vision, double vision, photophobia, pain, discharge and redness.  Respiratory: Negative for cough, hemoptysis, sputum production, shortness of breath, wheezing and stridor.   Cardiovascular: Negative for chest pain, palpitations, orthopnea, claudication, leg swelling and PND.  Gastrointestinal: Positive for abdominal pain. Negative for heartburn, nausea, vomiting, diarrhea, constipation, blood in stool and melena.  Genitourinary: Negative for dysuria, urgency, frequency, hematuria and flank pain.  Musculoskeletal: Negative for myalgias, back pain, joint pain and falls.  Skin: Negative for itching and rash.  Neurological: Negative for dizziness, tingling, tremors, sensory change, speech change, focal weakness, seizures, loss of consciousness, weakness and headaches.  Endo/Heme/Allergies: Negative for environmental allergies and polydipsia. Does not bruise/bleed easily.   Psychiatric/Behavioral: Negative for depression, suicidal ideas, hallucinations, memory loss and substance abuse. The patient is not nervous/anxious and does not have insomnia.      PHYSICAL EXAM:  There were no vitals taken for this visit.    Vitals reviewed. Constitutional: She is oriented to person, place, and time. She appears well-developed and well-nourished.  HENT:  Head: Normocephalic and atraumatic.  Right Ear: External ear normal.  Left Ear: External ear normal.  Nose: Nose normal.  Mouth/Throat: Oropharynx is clear and moist.  Eyes: Conjunctivae and EOM are normal. Pupils are equal, round, and reactive to light. Right eye exhibits no discharge. Left eye exhibits no discharge. No scleral icterus.  Neck: Normal range of motion. Neck supple. No tracheal deviation present. No thyromegaly present.  Cardiovascular: Normal rate, regular rhythm, normal heart sounds and intact distal pulses.  Exam reveals no gallop and no friction rub.   No murmur heard. Respiratory: Effort normal and breath sounds normal. No respiratory distress. She has no wheezes. She has no rales. She exhibits no tenderness.  GI: Soft. Bowel sounds are normal. She exhibits no distension and no mass. There is tenderness. There is no rebound and no guarding.  Genitourinary:       Vulva is normal without lesions Vagina is pink moist without discharge Cervix normal in appearance and pap is normal Uterus is bulky 240 cc on sonogram Adnexa is negative with normal sized ovaries by sonogram  Musculoskeletal: Normal range of motion. She exhibits no edema and no tenderness.  Neurological: She is alert and oriented to person, place, and time. She has normal reflexes. She displays normal reflexes. No cranial nerve deficit. She exhibits normal muscle tone. Coordination normal.  Skin: Skin is warm and dry. No rash noted. No erythema. No pallor.  Psychiatric: She has a normal mood and affect. Her behavior is normal. Judgment  and thought content normal.    Labs: Results for orders placed or performed during the hospital encounter of 02/14/23 (from the past 336 hour(s))  CBC   Collection Time: 02/14/23  8:24 AM  Result Value Ref Range   WBC 5.4 4.0 - 10.5 K/uL   RBC 4.98 3.87 - 5.11 MIL/uL   Hemoglobin 13.3 12.0 - 15.0 g/dL   HCT 08.6 57.8 - 46.9 %   MCV 82.9 80.0 - 100.0 fL   MCH 26.7 26.0 - 34.0 pg   MCHC 32.2 30.0 - 36.0 g/dL   RDW 62.9 (H) 52.8 - 41.3 %   Platelets 222 150 - 400 K/uL   nRBC 0.0 0.0 - 0.2 %  Comprehensive metabolic panel   Collection Time: 02/14/23  8:24 AM  Result Value Ref Range   Sodium 136 135 - 145 mmol/L   Potassium 4.2 3.5 - 5.1 mmol/L  Chloride 104 98 - 111 mmol/L   CO2 24 22 - 32 mmol/L   Glucose, Bld 86 70 - 99 mg/dL   BUN 11 6 - 20 mg/dL   Creatinine, Ser 1.61 0.44 - 1.00 mg/dL   Calcium 9.2 8.9 - 09.6 mg/dL   Total Protein 7.6 6.5 - 8.1 g/dL   Albumin 4.3 3.5 - 5.0 g/dL   AST 20 15 - 41 U/L   ALT 26 0 - 44 U/L   Alkaline Phosphatase 68 38 - 126 U/L   Total Bilirubin 0.7 0.3 - 1.2 mg/dL   GFR, Estimated >04 >54 mL/min   Anion gap 8 5 - 15  Rapid HIV screen (HIV 1/2 Ab+Ag)   Collection Time: 02/14/23  8:24 AM  Result Value Ref Range   HIV-1 P24 Antigen - HIV24 NON REACTIVE NON REACTIVE   HIV 1/2 Antibodies NON REACTIVE NON REACTIVE   Interpretation (HIV Ag Ab)      A non reactive test result means that HIV 1 or HIV 2 antibodies and HIV 1 p24 antigen were not detected in the specimen.  Urinalysis, Routine w reflex microscopic -Urine, Clean Catch   Collection Time: 02/14/23  8:24 AM  Result Value Ref Range   Color, Urine YELLOW YELLOW   APPearance HAZY (A) CLEAR   Specific Gravity, Urine 1.016 1.005 - 1.030   pH 5.0 5.0 - 8.0   Glucose, UA NEGATIVE NEGATIVE mg/dL   Hgb urine dipstick NEGATIVE NEGATIVE   Bilirubin Urine NEGATIVE NEGATIVE   Ketones, ur NEGATIVE NEGATIVE mg/dL   Protein, ur NEGATIVE NEGATIVE mg/dL   Nitrite NEGATIVE NEGATIVE   Leukocytes,Ua  TRACE (A) NEGATIVE   RBC / HPF 0-5 0 - 5 RBC/hpf   WBC, UA 0-5 0 - 5 WBC/hpf   Bacteria, UA RARE (A) NONE SEEN   Squamous Epithelial / HPF 0-5 0 - 5 /HPF   Mucus PRESENT   Pregnancy, urine   Collection Time: 02/14/23  8:24 AM  Result Value Ref Range   Preg Test, Ur NEGATIVE NEGATIVE    EKG: No orders found for this or any previous visit.  Imaging Studies: No results found.    Assessment: Menometrorrhagia dysmenorrhea  Plan: Xi Robot assisted TLH and bilateral salpingectomy  Lazaro Arms 02/16/2023 7:08 AM

## 2023-02-16 NOTE — Anesthesia Preprocedure Evaluation (Signed)
Anesthesia Evaluation  Patient identified by MRN, date of birth, ID band Patient awake    Reviewed: Allergy & Precautions, H&P , NPO status , Patient's Chart, lab work & pertinent test results  History of Anesthesia Complications (+) PONV and history of anesthetic complications  Airway Mallampati: II  TM Distance: >3 FB Neck ROM: Full    Dental  (+) Dental Advisory Given, Teeth Intact   Pulmonary neg pulmonary ROS   Pulmonary exam normal breath sounds clear to auscultation       Cardiovascular Exercise Tolerance: Good negative cardio ROS Normal cardiovascular exam Rhythm:Regular Rate:Normal     Neuro/Psych  PSYCHIATRIC DISORDERS Anxiety     negative neurological ROS     GI/Hepatic negative GI ROS, Neg liver ROS,,,  Endo/Other  negative endocrine ROS    Renal/GU negative Renal ROS  negative genitourinary   Musculoskeletal negative musculoskeletal ROS (+)    Abdominal   Peds negative pediatric ROS (+)  Hematology  (+) Blood dyscrasia, anemia   Anesthesia Other Findings   Reproductive/Obstetrics negative OB ROS                             Anesthesia Physical Anesthesia Plan  ASA: 3  Anesthesia Plan: General   Post-op Pain Management: Dilaudid IV   Induction: Intravenous  PONV Risk Score and Plan: 4 or greater and Ondansetron, Dexamethasone, Midazolam and Scopolamine patch - Pre-op  Airway Management Planned: Oral ETT  Additional Equipment:   Intra-op Plan:   Post-operative Plan: Extubation in OR  Informed Consent: I have reviewed the patients History and Physical, chart, labs and discussed the procedure including the risks, benefits and alternatives for the proposed anesthesia with the patient or authorized representative who has indicated his/her understanding and acceptance.     Dental advisory given  Plan Discussed with: CRNA and Surgeon  Anesthesia Plan Comments:          Anesthesia Quick Evaluation

## 2023-02-16 NOTE — Transfer of Care (Signed)
Immediate Anesthesia Transfer of Care Note  Patient: Christine Ritter  Procedure(s) Performed: XI ROBOTIC ASSISTED LAPAROSCOPIC HYSTERECTOMY AND SALPINGECTOMY (Abdomen)  Patient Location: PACU  Anesthesia Type:General  Level of Consciousness: drowsy and patient cooperative  Airway & Oxygen Therapy: Patient Spontanous Breathing and Patient connected to face mask oxygen  Post-op Assessment: Report given to RN and Post -op Vital signs reviewed and unstable, Anesthesiologist notified  Post vital signs: Reviewed and stable  Last Vitals:  Vitals Value Taken Time  BP 94/63 02/16/23 1031  Temp 97.4 F 02/16/23   1033  Pulse 63 02/16/23 1033  Resp 14 02/16/23 1033  SpO2 100 % 02/16/23 1033  Vitals shown include unvalidated device data.  Last Pain:  Vitals:   02/16/23 0650  PainSc: 0-No pain         Complications: No notable events documented.

## 2023-02-16 NOTE — Anesthesia Postprocedure Evaluation (Signed)
Anesthesia Post Note  Patient: Christine Ritter  Procedure(s) Performed: XI ROBOTIC ASSISTED LAPAROSCOPIC HYSTERECTOMY AND SALPINGECTOMY (Abdomen)  Patient location during evaluation: Phase II Anesthesia Type: General Level of consciousness: awake and alert and oriented Pain management: pain level controlled Vital Signs Assessment: post-procedure vital signs reviewed and stable Respiratory status: spontaneous breathing, nonlabored ventilation and respiratory function stable Cardiovascular status: blood pressure returned to baseline and stable Postop Assessment: no apparent nausea or vomiting Anesthetic complications: no  No notable events documented.   Last Vitals:  Vitals:   02/16/23 1130 02/16/23 1144  BP: 115/79 127/77  Pulse: (!) 57 64  Resp: 20 16  Temp:  (!) 36.3 C  SpO2: 99% 100%    Last Pain:  Vitals:   02/16/23 1144  PainSc: 0-No pain                 Hillari Zumwalt C Kierra Jezewski

## 2023-02-17 ENCOUNTER — Encounter: Payer: Self-pay | Admitting: Obstetrics & Gynecology

## 2023-02-17 LAB — SURGICAL PATHOLOGY

## 2023-02-18 ENCOUNTER — Encounter (HOSPITAL_COMMUNITY): Payer: Self-pay | Admitting: Obstetrics & Gynecology

## 2023-02-25 ENCOUNTER — Encounter: Payer: Self-pay | Admitting: Obstetrics & Gynecology

## 2023-02-25 ENCOUNTER — Ambulatory Visit (INDEPENDENT_AMBULATORY_CARE_PROVIDER_SITE_OTHER): Payer: 59 | Admitting: Obstetrics & Gynecology

## 2023-02-25 VITALS — BP 104/70 | HR 72 | Ht 69.0 in | Wt 273.0 lb

## 2023-02-25 DIAGNOSIS — Z9889 Other specified postprocedural states: Secondary | ICD-10-CM

## 2023-02-25 MED ORDER — SILVER SULFADIAZINE 1 % EX CREA: TOPICAL_CREAM | CUTANEOUS | 11 refills | Status: AC

## 2023-02-25 MED ORDER — FLUCONAZOLE 150 MG PO TABS: ORAL_TABLET | ORAL | 1 refills | Status: AC

## 2023-02-25 NOTE — Progress Notes (Signed)
  HPI: Patient returns for routine postoperative follow-up having undergone RA TLH BS on 02/16/23.  The patient's immediate postoperative recovery has been unremarkable. Since hospital discharge the patient reports no problems.   Current Outpatient Medications: acetaminophen (TYLENOL) 325 MG tablet, Take 325 mg by mouth every 6 (six) hours as needed for moderate pain or headache., Disp: , Rfl:  b complex vitamins capsule, Take 1 capsule by mouth daily., Disp: , Rfl:  Biotin 82956 MCG TABS, Take 10,000 mcg by mouth daily., Disp: , Rfl:  Cholecalciferol 50 MCG (2000 UT) TABS, Take 2,000 Units by mouth daily., Disp: , Rfl:  ferrous sulfate 325 (65 FE) MG tablet, Take 1 tablet (325 mg total) by mouth every other day., Disp: 45 tablet, Rfl: 3 fluconazole (DIFLUCAN) 150 MG tablet, Take 1 tablet today and repeat in 3 days, Disp: 2 tablet, Rfl: 1 Multiple Vitamin (MULTI-VITAMIN) tablet, Take 1 tablet by mouth daily., Disp: , Rfl:  Probiotic Product (PROBIOTIC PO), Take 1 capsule by mouth daily., Disp: , Rfl:  silver sulfADIAZINE (SILVADENE) 1 % cream, Apply 2-3 times per day, Disp: 50 g, Rfl: 11 vitamin C (ASCORBIC ACID) 250 MG tablet, Take 250 mg by mouth daily., Disp: , Rfl:  Zinc 50 MG TABS, Take 50 mg by mouth daily., Disp: , Rfl:  nystatin (MYCOSTATIN/NYSTOP) powder, Apply 1 Application topically 3 (three) times daily. (Patient taking differently: Apply 1 Application topically 3 (three) times daily as needed (yeast).), Disp: 15 g, Rfl: 0  No current facility-administered medications for this visit.    Blood pressure 104/70, pulse 72, height 5\' 9"  (1.753 m), weight 273 lb (123.8 kg).  Physical Exam: 4 incisions all healing well All 4 appear to have had a reaction to the dermabond?, will cover with the silvdene  Diagnostic Tests:   Pathology: benign  Impression + Management plan: No diagnosis found.    Medications Prescribed this encounter: Orders Placed This Encounter      fluconazole (DIFLUCAN) 150 MG tablet         Sig: Take 1 tablet today and repeat in 3 days         Dispense:  2 tablet         Refill:  1     silver sulfADIAZINE (SILVADENE) 1 % cream         Sig: Apply 2-3 times per day         Dispense:  50 g         Refill:  11     Follow up: No follow-ups on file.    Lazaro Arms, MD Attending Physician for the Center for [facility] and [facility] 02/25/2023 9:30 AM

## 2023-04-08 ENCOUNTER — Encounter: Payer: Self-pay | Admitting: Obstetrics & Gynecology

## 2023-04-08 ENCOUNTER — Ambulatory Visit (INDEPENDENT_AMBULATORY_CARE_PROVIDER_SITE_OTHER): Payer: 59 | Admitting: Obstetrics & Gynecology

## 2023-04-08 VITALS — BP 105/72 | HR 68 | Ht 68.4 in | Wt 279.0 lb

## 2023-04-08 DIAGNOSIS — Z9889 Other specified postprocedural states: Secondary | ICD-10-CM

## 2023-04-08 NOTE — Progress Notes (Signed)
  HPI: Patient returns for routine postoperative follow-up having undergone RA TLH BS on 02/16/23.  The patient's immediate postoperative recovery has been unremarkable. Since hospital discharge the patient reports no problems some suture spitting.   Current Outpatient Medications: b complex vitamins capsule, Take 1 capsule by mouth daily., Disp: , Rfl:  Biotin 78295 MCG TABS, Take 10,000 mcg by mouth daily., Disp: , Rfl:  Cholecalciferol 50 MCG (2000 UT) TABS, Take 2,000 Units by mouth daily., Disp: , Rfl:  ferrous sulfate 325 (65 FE) MG tablet, Take 1 tablet (325 mg total) by mouth every other day., Disp: 45 tablet, Rfl: 3 Multiple Vitamin (MULTI-VITAMIN) tablet, Take 1 tablet by mouth daily., Disp: , Rfl:  Probiotic Product (PROBIOTIC PO), Take 1 capsule by mouth daily., Disp: , Rfl:  vitamin C (ASCORBIC ACID) 250 MG tablet, Take 250 mg by mouth daily., Disp: , Rfl:  Zinc 50 MG TABS, Take 50 mg by mouth daily., Disp: , Rfl:  acetaminophen (TYLENOL) 325 MG tablet, Take 325 mg by mouth every 6 (six) hours as needed for moderate pain or headache. (Patient not taking: Reported on 04/08/2023), Disp: , Rfl:  fluconazole (DIFLUCAN) 150 MG tablet, Take 1 tablet today and repeat in 3 days (Patient not taking: Reported on 04/08/2023), Disp: 2 tablet, Rfl: 1 nystatin (MYCOSTATIN/NYSTOP) powder, Apply 1 Application topically 3 (three) times daily. (Patient not taking: Reported on 04/08/2023), Disp: 15 g, Rfl: 0 silver sulfADIAZINE (SILVADENE) 1 % cream, Apply 2-3 times per day (Patient not taking: Reported on 04/08/2023), Disp: 50 g, Rfl: 11  No current facility-administered medications for this visit.    Blood pressure 105/72, pulse 68, height 5' 8.4" (1.737 m), weight 279 lb (126.6 kg), last menstrual period 10/29/2022.  Physical Exam: 4 incisions well healed Abdomen is benign Cuff is healing well  Diagnostic Tests:   Pathology: benign  Impression + Management plan:   ICD-10-CM   1.  Post-operative state: RA TLH BS 02/16/23  Z98.890    no vaginal intercourse until 05/19/23         Medications Prescribed this encounter: No orders of the defined types were placed in this encounter.     Follow up: Prn    Lazaro Arms, MD Attending Physician for the Center for [facility] and [facility] 04/08/2023 9:12 AM

## 2023-04-20 ENCOUNTER — Encounter: Payer: Self-pay | Admitting: Obstetrics & Gynecology

## 2023-04-20 ENCOUNTER — Ambulatory Visit (INDEPENDENT_AMBULATORY_CARE_PROVIDER_SITE_OTHER): Payer: 59 | Admitting: Obstetrics & Gynecology

## 2023-04-20 VITALS — BP 139/82 | HR 84 | Ht 68.0 in | Wt 279.0 lb

## 2023-04-20 DIAGNOSIS — Z90710 Acquired absence of both cervix and uterus: Secondary | ICD-10-CM

## 2023-04-20 DIAGNOSIS — R300 Dysuria: Secondary | ICD-10-CM

## 2023-04-20 NOTE — Progress Notes (Signed)
   GYN VISIT Patient name: Christine Ritter MRN [id number]  Date of birth: [birth year] Chief Complaint:   Urinary Tract Infection  History of Present Illness:   Christine Ritter is a 42 y.o. G3P3003 PH (completed 7/30) female being seen today for the following concerns:  Pelvic discomfort: This am noted considerable pelvic pain almost as though she needed to void.  However, when she voided it was a slow prolonged void.  After about cramping has slowly improved.  Thought maybe there was some blood in her urine this am.  Denies urinary urgency or frequency.  No back pain.  No fever or chills.  Reports no other acute complaints or concerns  Patient is still on pelvic rest for an additional 4 weeks Patient's last menstrual period was 10/29/2022.    Review of Systems:   Pertinent items are noted in HPI Denies fever/chills, dizziness, headaches, visual disturbances, fatigue, shortness of breath, chest pain, abdominal pain, vomiting. Pertinent History Reviewed:   Past Surgical History:  Procedure Laterality Date   BACK SURGERY     CESAREAN SECTION     CESAREAN SECTION N/A 12/10/2020   Procedure: CESAREAN SECTION;  Surgeon: Lazaro Arms, MD;  Location: [facility] LD [facility];  Service: Obstetrics;  Laterality: N/A;   ROBOTIC ASSISTED LAPAROSCOPIC HYSTERECTOMY AND SALPINGECTOMY N/A 02/16/2023   Procedure: XI ROBOTIC ASSISTED LAPAROSCOPIC HYSTERECTOMY AND SALPINGECTOMY;  Surgeon: Lazaro Arms, MD;  Location: [facility] ORS;  Service: Gynecology;  Laterality: N/A;   WISDOM TOOTH EXTRACTION      Past Medical History:  Diagnosis Date   Anemia    Anxiety    Back pain    PONV (postoperative nausea and vomiting)    Pregnancy    Reviewed problem list, medications and allergies. Physical Assessment:   Vitals:   04/20/23 1325  BP: 139/82  Pulse: 84  Weight: 279 lb (126.6 kg)  Height: 5\' 8"  (1.727 m)  Body mass index is 42.42 kg/m.       Physical Examination:   General appearance: alert, well appearing,  and in no distress  Psych: mood appropriate, normal affect  Skin: warm & dry   Cardiovascular: normal heart rate noted  Respiratory: normal respiratory effort, no distress  Abdomen: soft, mild suprapubic tenderness.  No rebound no guarding.  Incisions well-healed appear clean dry and intact  Pelvic: examination not indicated  Extremities: no edema   Chaperone: N/A    Assessment & Plan:  1) Pelvic pain, urinary discomfort -Plan to rule out underlying infection-UA and culture collected -Further management pending results.  If negative may consider follow-up exam in about 2 weeks   Orders Placed This Encounter  Procedures   Urine Culture   Urinalysis    Return if symptoms worsen or fail to improve.   Myna Hidalgo, DO Attending Obstetrician & Gynecologist, [facility] for [employer]

## 2023-04-21 LAB — URINALYSIS
Bilirubin, UA: NEGATIVE
Glucose, UA: NEGATIVE
Ketones, UA: NEGATIVE
Leukocytes,UA: NEGATIVE
Nitrite, UA: NEGATIVE
Protein,UA: NEGATIVE
RBC, UA: NEGATIVE
Specific Gravity, UA: 1.024 (ref 1.005–1.030)
Urobilinogen, Ur: 0.2 mg/dL (ref 0.2–1.0)
pH, UA: 5.5 (ref 5.0–7.5)

## 2023-04-22 LAB — URINE CULTURE

## 2023-04-29 ENCOUNTER — Other Ambulatory Visit (HOSPITAL_COMMUNITY): Payer: Self-pay | Admitting: Obstetrics & Gynecology

## 2023-04-29 DIAGNOSIS — Z1231 Encounter for screening mammogram for malignant neoplasm of breast: Secondary | ICD-10-CM

## 2023-05-02 ENCOUNTER — Ambulatory Visit: Payer: 59 | Admitting: Podiatry

## 2023-05-10 ENCOUNTER — Ambulatory Visit (INDEPENDENT_AMBULATORY_CARE_PROVIDER_SITE_OTHER): Payer: 59

## 2023-05-10 ENCOUNTER — Ambulatory Visit (INDEPENDENT_AMBULATORY_CARE_PROVIDER_SITE_OTHER): Payer: 59 | Admitting: Podiatry

## 2023-05-10 DIAGNOSIS — L600 Ingrowing nail: Secondary | ICD-10-CM | POA: Diagnosis not present

## 2023-05-10 DIAGNOSIS — M778 Other enthesopathies, not elsewhere classified: Secondary | ICD-10-CM

## 2023-05-10 DIAGNOSIS — M21621 Bunionette of right foot: Secondary | ICD-10-CM

## 2023-05-10 NOTE — Patient Instructions (Signed)

## 2023-05-13 NOTE — Progress Notes (Signed)
Subjective: 42 year old female presents the office today with above concerns.  She states that she is still getting pain to the outside aspect of her right foot.  This is where she had been previously but it has been getting worse.  She does not report any recent injuries or changes.  She also has secondary concerns that she may be more of an issue at this time.  She gets ingrown portion of the right hallux toenail on the lateral aspect which is causing pain that she has pressure.  She has not noticed any drainage or pus.  No other concerns.  Objective: AAO x3, NAD DP/PT pulses palpable bilaterally, CRT less than 3 seconds The right foot the majority tenderness is localized along the fifth metatarsal head on the fifth MTPJ.  There is trace edema but no erythema or warmth.  No erythema point tenderness identified otherwise. Incurvation present to the right lateral hallux nail border with localized edema and faint erythema likely more from inflammation as opposed to infection.  There is no drainage or pus.  There is no ascending cellulitis.  Tenderness palpation of the nail border.  No other areas of discomfort. No pain with calf compression, swelling, warmth, erythema  Assessment: Capsulitis right foot, ingrown toenail  Plan: -All treatment options discussed with the patient including all alternatives, risks, complications.  -X-rays obtained reviewed.  3 views of the foot were obtained.  No evidence of acute fracture.  Splayfoot is present. -In regards to the foot pain we discussed steroid injection but ultimately held off on this if he has ingrown toenail procedure.  We discussed shoe modifications, orthotics.  She is to get measured for orthotics next appointment. -At this time, the patient is requesting partial nail removal with chemical matricectomy to the symptomatic portion of the nail. Risks and complications were discussed with the patient for which they understand and written consent was  obtained. Under sterile conditions a total of 3 mL of a mixture of 2% lidocaine plain and 0.5% Marcaine plain was infiltrated in a hallux block fashion. Once anesthetized, the skin was prepped in sterile fashion. A tourniquet was then applied. Next the lateral aspect of hallux nail border was then sharply excised making sure to remove the entire offending nail border. Once the nails were ensured to be removed area was debrided and the underlying skin was intact. There is no purulence identified in the procedure. Next sodium hydroxide was then applied under standard conditions and copiously irrigated.  Silvadene was applied. A dry sterile dressing was applied. After application of the dressing the tourniquet was removed and there is found to be an immediate capillary refill time to the digit. The patient tolerated the procedure well any complications. Post procedure instructions were discussed the patient for which he verbally understood. Discussed signs/symptoms of infection and directed to call the office immediately should any occur or go directly to the emergency room. In the meantime, encouraged to call the office with any questions, concerns, changes symptoms. -Patient encouraged to call the office with any questions, concerns, change in symptoms.   Vivi Barrack DPM

## 2023-05-24 ENCOUNTER — Ambulatory Visit (INDEPENDENT_AMBULATORY_CARE_PROVIDER_SITE_OTHER): Payer: 59 | Admitting: Podiatry

## 2023-05-24 ENCOUNTER — Encounter: Payer: Self-pay | Admitting: Podiatry

## 2023-05-24 ENCOUNTER — Other Ambulatory Visit: Payer: 59

## 2023-05-24 DIAGNOSIS — L539 Erythematous condition, unspecified: Secondary | ICD-10-CM

## 2023-05-24 DIAGNOSIS — L600 Ingrowing nail: Secondary | ICD-10-CM

## 2023-05-24 MED ORDER — SULFAMETHOXAZOLE-TRIMETHOPRIM 800-160 MG PO TABS: 1.0000 | ORAL_TABLET | Freq: Two times a day (BID) | ORAL | 0 refills | Status: AC

## 2023-05-24 NOTE — Progress Notes (Signed)
Subjective: Chief Complaint  Patient presents with   Routine Post Op    PATIENT STATES THAT FOOT PAIN HAS BEEN THE SAME BUT THE NAIL OF HER RIGHT HALLUX FEELS BETTER, TOE IS A LITTLE RED BUT IT DOESN'T LOOK INFECTED TO HER.   42 year old female presents the office the above concerns.  She is stable because she started having some redness and but no purulence.  No red streaks.  Does not endorse any fevers or chills.  She still been soaking.  Objective: AAO x3, NAD DP/PT pulses palpable bilaterally, CRT less than 3 seconds Status post partial nail avulsion.  Small scab is present.  There is local edema and erythema present.  There is no ascending cellulitis.  No purulence present.  There is mild tenderness palpation on the nail border. Overall exam unchanged with regards to the pain on the tailor's bunion, lateral aspect the foot. No pain with calf compression, swelling, warmth, erythema  Assessment: Status post partial nail avulsion with localized erythema  Plan: -All treatment options discussed with the patient including all alternatives, risks, complications.  -The erythema I cleaned the area with peroxide and including start antibiotics.  Prescribed Bactrim.  Continue soaking Epsom salts and wash with soap and water.  Antibiotic ointment dressing changes during the day and that the area open at night.  Monitoring signs or symptoms of infection or worsening to me know immediately. -I offered steroid injection today we will plan on doing this when she comes back for orthotic molding. -Patient encouraged to call the office with any questions, concerns, change in symptoms.   Vivi Barrack DPM

## 2023-06-01 ENCOUNTER — Ambulatory Visit (HOSPITAL_COMMUNITY)
Admission: RE | Admit: 2023-06-01 | Discharge: 2023-06-01 | Disposition: A | Discharge status: Home / Self Care | Payer: 59 | Source: Ambulatory Visit | Attending: Obstetrics & Gynecology | Admitting: Obstetrics & Gynecology

## 2023-06-01 ENCOUNTER — Encounter (HOSPITAL_COMMUNITY): Payer: Self-pay

## 2023-06-01 DIAGNOSIS — Z1231 Encounter for screening mammogram for malignant neoplasm of breast: Secondary | ICD-10-CM | POA: Insufficient documentation

## 2023-06-07 ENCOUNTER — Ambulatory Visit (INDEPENDENT_AMBULATORY_CARE_PROVIDER_SITE_OTHER): Payer: 59 | Admitting: Podiatry

## 2023-06-07 ENCOUNTER — Ambulatory Visit (INDEPENDENT_AMBULATORY_CARE_PROVIDER_SITE_OTHER): Payer: 59

## 2023-06-07 DIAGNOSIS — M778 Other enthesopathies, not elsewhere classified: Secondary | ICD-10-CM

## 2023-06-07 DIAGNOSIS — L539 Erythematous condition, unspecified: Secondary | ICD-10-CM | POA: Diagnosis not present

## 2023-06-07 DIAGNOSIS — M21621 Bunionette of right foot: Secondary | ICD-10-CM | POA: Diagnosis not present

## 2023-06-07 NOTE — Patient Instructions (Signed)

## 2023-06-07 NOTE — Progress Notes (Signed)
  Patient was seen, measured / scanned for custom molded foot orthotics.  Patient will benefit from CFO's as they will help provide total contact to MLA's helping to better distribute body weight across BIL feet greater reducing plantar pressure and pain and to also encourage FF and RF alignment.  Patient was scanned items to be ordered and fit when in  [employer], CFo, CFm

## 2023-06-07 NOTE — Progress Notes (Signed)
Subjective: No chief complaint on file.   42 year old female presents the office for follow-up evaluation of ingrown toenail right big toe as well as for injection on the right foot.  She states that she has worsening vessels looking great and then when she finished it turned red to the tip of the toe but now it has improved again.  She is upset and a couple days ago.  Still some mild soreness.  No drainage or pus.  She still gets pain on the right foot mostly the fifth MPJ and she is also presents to get measured for orthotics today.  Objective: AAO x3, NAD DP/PT pulses palpable bilaterally, CRT less than 3 seconds  Right hallux erythema scabbing present there is localized edema and erythema around this area but there is no ascending cellulitis there is no drainage or pus or any signs of infection otherwise today.  He does appear to be improved compared to what it was last appointment. Moderate tenderness as well as the right fifth MTPJ with minimal edema.  There is no erythema or warmth.  No areas of pinpoint tenderness. No pain with calf compression, swelling, warmth, erythema  Assessment: Status post partial nail avulsion with localized erythema-improving; capsulitis toe right  Plan: -All treatment options discussed with the patient including all alternatives, risks, complications.  -I debrided the scabs and the hallux toenail.  Continue soaking Epsom salts, wash with soap and water.  Antibiotic ointment dressing changes to debride the areas at nighttime.  I do expect this erythema and discomfort is 1 to 2 weeks but she did not call me know or sooner is any worsening. -Steroid injection performed today to the right fifth MPJ.  Clean skin with Betadine and alcohol.  Mixture of 0.5 cc of dexamethasone phosphate and 0.5 cc Marcaine plain was infiltrated to the area complications.  Postinjection care discussed.  Tolerated well. -She was seen by Nicki Guadalajara for orthotics today.   No follow-ups on  file.  Vivi Barrack DPM

## 2023-07-15 ENCOUNTER — Encounter: Payer: Self-pay | Admitting: Podiatry

## 2023-07-21 ENCOUNTER — Ambulatory Visit: Payer: 59

## 2023-07-21 NOTE — Progress Notes (Signed)
 Patient presents today to pick up custom molded foot orthotics, diagnosed with Capsulitis by Dr. Ardelle Anton.   Orthotics were dispensed and fit was satisfactory. Reviewed instructions for break-in and wear. Written instructions given to patient.  Patient will follow up as needed. Christine Ritter Cped, CFo, CFm

## 2023-08-22 ENCOUNTER — Encounter: Payer: Self-pay | Admitting: Obstetrics & Gynecology

## 2023-08-23 MED ORDER — NYSTATIN 100000 UNIT/GM EX POWD: 1.0000 | Freq: Three times a day (TID) | CUTANEOUS | 11 refills | Status: AC

## 2024-04-23 ENCOUNTER — Other Ambulatory Visit (HOSPITAL_COMMUNITY): Payer: Self-pay | Admitting: Obstetrics & Gynecology

## 2024-04-23 DIAGNOSIS — Z1231 Encounter for screening mammogram for malignant neoplasm of breast: Secondary | ICD-10-CM

## 2024-05-14 ENCOUNTER — Other Ambulatory Visit (HOSPITAL_COMMUNITY): Payer: Self-pay

## 2024-05-14 ENCOUNTER — Ambulatory Visit (HOSPITAL_COMMUNITY)
Admission: RE | Admit: 2024-05-14 | Discharge: 2024-05-14 | Disposition: A | Discharge status: Home / Self Care | Source: Ambulatory Visit

## 2024-05-14 DIAGNOSIS — M545 Low back pain, unspecified: Secondary | ICD-10-CM | POA: Diagnosis present

## 2024-06-06 ENCOUNTER — Ambulatory Visit (HOSPITAL_COMMUNITY)
Admission: RE | Admit: 2024-06-06 | Discharge: 2024-06-06 | Disposition: A | Discharge status: Home / Self Care | Source: Ambulatory Visit | Attending: Obstetrics & Gynecology | Admitting: Obstetrics & Gynecology

## 2024-06-06 ENCOUNTER — Encounter (HOSPITAL_COMMUNITY): Payer: Self-pay

## 2024-06-06 DIAGNOSIS — Z1231 Encounter for screening mammogram for malignant neoplasm of breast: Secondary | ICD-10-CM | POA: Diagnosis present

## 2024-06-10 ENCOUNTER — Ambulatory Visit (HOSPITAL_COMMUNITY): Payer: Self-pay | Admitting: Obstetrics & Gynecology

## 2024-06-26 ENCOUNTER — Ambulatory Visit: Admitting: Physical Therapy

## 2024-06-26 NOTE — Therapy (Deleted)
 OUTPATIENT PHYSICAL THERAPY EVALUATION   Patient Name: Christine Ritter MRN: [id number] DOB:1981/01/01, 43 y.o., female Today's Date: 06/26/2024  END OF SESSION:   Past Medical History:  Diagnosis Date   Anemia    Anxiety    Back pain    PONV (postoperative nausea and vomiting)    Pregnancy    Past Surgical History:  Procedure Laterality Date   BACK SURGERY     CESAREAN SECTION     CESAREAN SECTION N/A 12/10/2020   Procedure: CESAREAN SECTION;  Surgeon: Jayne Vonn DEL, MD;  Location: [facility] LD ORS;  Service: Obstetrics;  Laterality: N/A;   ROBOTIC ASSISTED LAPAROSCOPIC HYSTERECTOMY AND SALPINGECTOMY N/A 02/16/2023   Procedure: XI ROBOTIC ASSISTED LAPAROSCOPIC HYSTERECTOMY AND SALPINGECTOMY;  Surgeon: Jayne Vonn DEL, MD;  Location: [facility] ORS;  Service: Gynecology;  Laterality: N/A;   WISDOM TOOTH EXTRACTION     Patient Active Problem List   Diagnosis Date Noted   Dysmenorrhea 02/16/2023   Menometrorrhagia 06/21/2022   Herniated lumbar disc without myelopathy 04/17/2021   History of gestational diabetes 01/16/2021   Cesarean delivery delivered 12/10/2020   Anxiety 12/10/2020   History of vacuum extraction assisted delivery 12/10/2020   Family history of malignant neoplasm of breast 11/20/2020    PCP: Pcp, No   REFERRING PROVIDER: Onetha Kuba, MD   REFERRING DIAG: M54.40 (ICD-10-CM) - Lumbago with sciatica, unspecified side   Rationale for Evaluation and Treatment:  Rehabiliation  THERAPY DIAG:  No diagnosis found.  ONSET DATE: previous L5-S1 microdisectomy 2022 years ago.   SUBJECTIVE:                                                                                                                                                                                           SUBJECTIVE STATEMENT: ***  PERTINENT HISTORY:  See above PMH, previous L5-S1 microdisectomy 2022 years ago.  PAIN: *** NPRS scale: /10 upon arrival Pain location: Pain description: constant, achy,  sharp Aggravating factors:  Relieving factors: rest, meds   PRECAUTIONS: ,  {Therapy precautions:24002}  RED FLAGS: {PT Red Flags:29287}   WEIGHT BEARING RESTRICTIONS:  {Yes ***/No:24003}  FALLS:  Has patient fallen in last 6 months? {fallsyesno:27318}   OCCUPATION:  ***  PLOF:  {PLOF:24004}  PATIENT GOALS:  ***  OBJECTIVE:  Note: Objective measures were completed at Evaluation unless otherwise noted.  DIAGNOSTIC FINDINGS:  IMPRESSION: Mild multilevel degenerative changes of the lumbar spine.  PATIENT SURVEYS:  Patient-Specific Activity Scoring Scheme  0 represents "unable to perform." 10 represents "able to perform at prior level. 0 1 2 3 4 5 6 7 8 9  10 (Date and Score)  Activity Eval     1. ***      2. ***      3. ***    4.    5.    Score ***    Total score = sum of the activity scores/number of activities Minimum detectable change (90%CI) for average score = 2 points Minimum detectable change (90%CI) for single activity score = 3 points     EDEMA:  {Yes/No:[id number]}  POSTURE:  {posture:25561}  GAIT: Assistive device utilized: {Assistive devices:23999} Level of assistance: {Levels of assistance:24026} Comments: ***    {PT ROM TABLES:29304}  SPECIAL TESTS:  {PT Special Tests:29286}  FUNCTIONAL TESTS:  {Functional tests:24029}                                                                                                                              TREATMENT DATE:  Eval HEP creation and review with demonstration and trial set preformed, see below for details Selfcare:    PATIENT EDUCATION: Education details: HEP, PT plan of care, selfcare Person educated: Patient Education method: Explanation, Demonstration, Verbal cues, and Handouts Education comprehension: verbalized understanding, further education recommended   HOME EXERCISE PROGRAM: ***  ASSESSMENT:  CLINICAL IMPRESSION: Patient referred to PT for acute on  chronic back pain with previous L5-S1 microdisectomy 2022 years ago.. Patient will benefit from skilled PT to improve overall function and to address impairments and limitations listed below.  OBJECTIVE IMPAIRMENTS: decreased activity tolerance for ADL's, difficulty walking, decreased balance, decreased endurance, decreased mobility, decreased ROM, decreased strength, impaired flexibility, impaired UE/LE use, and pain.  ACTIVITY LIMITATIONS: bending, liftting, walking, standing, cleaning, community activity, driving, reaching, carry, occupation  PERSONAL FACTORS: see above PMH  also affecting patient's functional outcome.  REHAB POTENTIAL: {rehabpotential:25112}  CLINICAL DECISION MAKING: {clinical decision making:25114}  EVALUATION COMPLEXITY: {Evaluation complexity:25115}    GOALS: Short term PT Goals Target date: *** Pt will be I and compliant with HEP. Baseline:  Goal status: New Pt will decrease pain by 25% overall Baseline: Goal status: New  Long term PT goals Target date:*** Pt will improve ROM to [facility] to improve functional mobility Baseline: Goal status: New Pt will improve  strength to at least 4+/5 MMT to improve functional strength Baseline: Goal status: New Pt will improve Patient specific functional scale (PSFS) to at least /10 to show improved function level Baseline: Goal status: New Pt will reduce pain to overall less than 3/10 with usual activity and work activity. Baseline: Goal status: New Pt will be able to ambulate community distances at least 1000 ft WNL gait pattern without complaints Baseline: Goal status: New  PLAN: PT FREQUENCY: 1-3 times per week   PT DURATION: 6-8 weeks  PLANNED INTERVENTIONS  {rehab planned interventions:25118::97110-Therapeutic exercises,97530- Therapeutic [phone]- Neuromuscular re-education,97535- Self Rjmz,02859- Manual therapy,Patient/Family education}  PLAN FOR NEXT SESSION: *** NEXT MD VISIT:  PIERRETTE Redell JONELLE Maranda, PT 06/26/2024, 10:19 AM

## 2024-06-28 ENCOUNTER — Encounter: Payer: Self-pay | Admitting: Physical Therapy

## 2024-06-28 ENCOUNTER — Other Ambulatory Visit: Payer: Self-pay

## 2024-06-28 ENCOUNTER — Ambulatory Visit: Attending: Neurosurgery | Admitting: Physical Therapy

## 2024-06-28 DIAGNOSIS — M5459 Other low back pain: Secondary | ICD-10-CM | POA: Diagnosis present

## 2024-06-28 DIAGNOSIS — M6281 Muscle weakness (generalized): Secondary | ICD-10-CM | POA: Insufficient documentation

## 2024-06-28 DIAGNOSIS — R29898 Other symptoms and signs involving the musculoskeletal system: Secondary | ICD-10-CM | POA: Diagnosis present
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
## 2024-07-25 ENCOUNTER — Ambulatory Visit: Admitting: Physical Therapy

## 2024-07-31 ENCOUNTER — Ambulatory Visit: Admitting: Physical Therapy

## 2024-08-07 ENCOUNTER — Ambulatory Visit: Admitting: Physical Therapy

## 2024-08-14 ENCOUNTER — Ambulatory Visit: Admitting: Physical Therapy

## 2024-08-15 ENCOUNTER — Ambulatory Visit: Admitting: Physical Therapy

## 2024-08-16 ENCOUNTER — Ambulatory Visit: Admitting: Physical Therapy

## 2024-08-21 ENCOUNTER — Ambulatory Visit: Admitting: Physical Therapy

## 2024-08-23 ENCOUNTER — Ambulatory Visit: Admitting: Physical Therapy

## 2024-08-27 ENCOUNTER — Ambulatory Visit: Admitting: Physical Therapy

## 2024-08-29 ENCOUNTER — Ambulatory Visit: Admitting: Physical Therapy

## 2024-09-03 ENCOUNTER — Ambulatory Visit: Admitting: Physical Therapy

## 2024-09-05 ENCOUNTER — Ambulatory Visit: Admitting: Physical Therapy

## 2024-09-10 ENCOUNTER — Ambulatory Visit: Admitting: Physical Therapy
